# Patient Record
Sex: Female | Born: 1985 | Race: Black or African American | Hispanic: No | Marital: Married | Smoking: Current every day smoker
Health system: Southern US, Community
[De-identification: ages and names within clinical notes are randomized; demographics above are authoritative.]

## PROBLEM LIST (undated history)

## (undated) ENCOUNTER — Inpatient Hospital Stay: Payer: Self-pay

## (undated) DIAGNOSIS — J189 Pneumonia, unspecified organism: Secondary | ICD-10-CM

## (undated) DIAGNOSIS — F32A Depression, unspecified: Secondary | ICD-10-CM

## (undated) DIAGNOSIS — F419 Anxiety disorder, unspecified: Secondary | ICD-10-CM

## (undated) DIAGNOSIS — F329 Major depressive disorder, single episode, unspecified: Secondary | ICD-10-CM

## (undated) DIAGNOSIS — D649 Anemia, unspecified: Secondary | ICD-10-CM

## (undated) HISTORY — PX: TONSILLECTOMY: SUR1361

## (undated) HISTORY — DX: Depression, unspecified: F32.A

## (undated) HISTORY — DX: Anxiety disorder, unspecified: F41.9

## (undated) HISTORY — DX: Major depressive disorder, single episode, unspecified: F32.9

## (undated) SURGERY — Surgical Case
Anesthesia: Spinal

---

## 2007-08-07 ENCOUNTER — Emergency Department: Payer: Self-pay

## 2007-09-09 ENCOUNTER — Emergency Department: Payer: Self-pay | Admitting: Emergency Medicine

## 2007-11-13 ENCOUNTER — Emergency Department: Payer: Self-pay | Admitting: Emergency Medicine

## 2008-01-23 ENCOUNTER — Emergency Department: Payer: Self-pay | Admitting: Emergency Medicine

## 2008-05-16 ENCOUNTER — Emergency Department: Payer: Self-pay | Admitting: Emergency Medicine

## 2008-06-23 ENCOUNTER — Emergency Department (HOSPITAL_COMMUNITY): Admission: EM | Admit: 2008-06-23 | Discharge: 2008-06-23 | Payer: Self-pay | Admitting: Emergency Medicine

## 2008-09-06 ENCOUNTER — Emergency Department: Payer: Self-pay | Admitting: Emergency Medicine

## 2009-01-09 ENCOUNTER — Emergency Department: Payer: Self-pay | Admitting: Emergency Medicine

## 2009-03-30 ENCOUNTER — Emergency Department: Payer: Self-pay | Admitting: Emergency Medicine

## 2011-11-21 ENCOUNTER — Emergency Department: Payer: Self-pay | Admitting: Emergency Medicine

## 2011-12-24 ENCOUNTER — Observation Stay: Payer: Self-pay

## 2011-12-24 LAB — URINALYSIS, COMPLETE
Bacteria: NONE SEEN
Ketone: NEGATIVE
Protein: NEGATIVE
RBC,UR: 17 /HPF (ref 0–5)
WBC UR: 7 /HPF (ref 0–5)

## 2011-12-24 LAB — DRUG SCREEN, URINE
Amphetamines, Ur Screen: NEGATIVE (ref ?–1000)
Barbiturates, Ur Screen: NEGATIVE (ref ?–200)
Benzodiazepine, Ur Scrn: NEGATIVE (ref ?–200)
Cannabinoid 50 Ng, Ur ~~LOC~~: NEGATIVE (ref ?–50)
MDMA (Ecstasy)Ur Screen: NEGATIVE (ref ?–500)
Opiate, Ur Screen: NEGATIVE (ref ?–300)
Phencyclidine (PCP) Ur S: NEGATIVE (ref ?–25)

## 2011-12-25 LAB — URINE CULTURE

## 2012-01-08 ENCOUNTER — Observation Stay: Payer: Self-pay

## 2012-01-08 LAB — URINALYSIS, COMPLETE
Bacteria: NONE SEEN
Glucose,UR: NEGATIVE mg/dL (ref 0–75)
Protein: NEGATIVE
RBC,UR: NONE SEEN /HPF (ref 0–5)
Specific Gravity: 1.006 (ref 1.003–1.030)
Squamous Epithelial: <1
WBC UR: <1 /HPF (ref 0–5)

## 2012-01-31 ENCOUNTER — Observation Stay: Payer: Self-pay

## 2012-02-07 ENCOUNTER — Inpatient Hospital Stay: Payer: Self-pay

## 2012-02-08 LAB — CBC WITH DIFFERENTIAL/PLATELET
Basophil #: 0 10*3/uL (ref 0.0–0.1)
Eosinophil #: 0.2 10*3/uL (ref 0.0–0.7)
MCH: 27.3 pg (ref 26.0–34.0)
MCHC: 32.5 g/dL (ref 32.0–36.0)
Monocyte #: 1 x10 3/mm — ABNORMAL HIGH (ref 0.2–0.9)
Neutrophil %: 70.6 %
Platelet: 233 10*3/uL (ref 150–440)

## 2012-07-10 ENCOUNTER — Emergency Department: Payer: Self-pay | Admitting: Emergency Medicine

## 2012-07-10 LAB — COMPREHENSIVE METABOLIC PANEL
Albumin: 3.5 g/dL (ref 3.4–5.0)
Alkaline Phosphatase: 90 U/L (ref 50–136)
Anion Gap: 4 — ABNORMAL LOW (ref 7–16)
BUN: 9 mg/dL (ref 7–18)
Creatinine: 0.66 mg/dL (ref 0.60–1.30)
EGFR (African American): >60
EGFR (Non-African Amer.): >60
Glucose: 80 mg/dL (ref 65–99)
Osmolality: 279 (ref 275–301)
SGOT(AST): 18 U/L (ref 15–37)
SGPT (ALT): 18 U/L (ref 12–78)

## 2012-07-10 LAB — URINALYSIS, COMPLETE
Bacteria: NONE SEEN
Ketone: NEGATIVE
Ph: 8 (ref 4.5–8.0)
Protein: NEGATIVE
Specific Gravity: 1.021 (ref 1.003–1.030)
Squamous Epithelial: 1

## 2012-07-10 LAB — CBC
HGB: 12 g/dL (ref 12.0–16.0)
MCH: 25.7 pg — ABNORMAL LOW (ref 26.0–34.0)
Platelet: 215 10*3/uL (ref 150–440)
RBC: 4.66 10*6/uL (ref 3.80–5.20)
WBC: 7.3 10*3/uL (ref 3.6–11.0)

## 2012-07-10 LAB — LIPASE, BLOOD: Lipase: 101 U/L (ref 73–393)

## 2012-07-31 ENCOUNTER — Emergency Department: Payer: Self-pay | Admitting: Emergency Medicine

## 2012-07-31 LAB — COMPREHENSIVE METABOLIC PANEL
Albumin: 3.8 g/dL (ref 3.4–5.0)
Alkaline Phosphatase: 92 U/L (ref 50–136)
BUN: 6 mg/dL — ABNORMAL LOW (ref 7–18)
Bilirubin,Total: 0.6 mg/dL (ref 0.2–1.0)
Calcium, Total: 8.6 mg/dL (ref 8.5–10.1)
Co2: 23 mmol/L (ref 21–32)
Creatinine: 0.76 mg/dL (ref 0.60–1.30)
EGFR (African American): >60
Osmolality: 273 (ref 275–301)
SGOT(AST): 21 U/L (ref 15–37)

## 2012-07-31 LAB — URINALYSIS, COMPLETE
Bilirubin,UR: NEGATIVE
Glucose,UR: NEGATIVE mg/dL (ref 0–75)
Ketone: NEGATIVE
Leukocyte Esterase: NEGATIVE
Nitrite: NEGATIVE
Ph: 6 (ref 4.5–8.0)
Specific Gravity: 1.026 (ref 1.003–1.030)
Squamous Epithelial: 4

## 2012-07-31 LAB — RAPID INFLUENZA A&B ANTIGENS

## 2012-07-31 LAB — CBC
MCH: 26.3 pg (ref 26.0–34.0)
MCHC: 31.7 g/dL — ABNORMAL LOW (ref 32.0–36.0)
Platelet: 228 10*3/uL (ref 150–440)
RBC: 4.81 10*6/uL (ref 3.80–5.20)
WBC: 10.6 10*3/uL (ref 3.6–11.0)

## 2012-08-02 ENCOUNTER — Encounter (HOSPITAL_COMMUNITY): Payer: Self-pay | Admitting: Emergency Medicine

## 2012-08-02 ENCOUNTER — Emergency Department (HOSPITAL_COMMUNITY): Payer: Medicaid Other

## 2012-08-02 ENCOUNTER — Emergency Department (HOSPITAL_COMMUNITY)
Admission: EM | Admit: 2012-08-02 | Discharge: 2012-08-02 | Disposition: A | Discharge status: Home / Self Care | Payer: Medicaid Other | Attending: Emergency Medicine | Admitting: Emergency Medicine

## 2012-08-02 DIAGNOSIS — R0602 Shortness of breath: Secondary | ICD-10-CM | POA: Insufficient documentation

## 2012-08-02 DIAGNOSIS — Z862 Personal history of diseases of the blood and blood-forming organs and certain disorders involving the immune mechanism: Secondary | ICD-10-CM | POA: Insufficient documentation

## 2012-08-02 DIAGNOSIS — Z8701 Personal history of pneumonia (recurrent): Secondary | ICD-10-CM | POA: Insufficient documentation

## 2012-08-02 DIAGNOSIS — B349 Viral infection, unspecified: Secondary | ICD-10-CM

## 2012-08-02 DIAGNOSIS — R197 Diarrhea, unspecified: Secondary | ICD-10-CM | POA: Insufficient documentation

## 2012-08-02 DIAGNOSIS — N949 Unspecified condition associated with female genital organs and menstrual cycle: Secondary | ICD-10-CM | POA: Insufficient documentation

## 2012-08-02 DIAGNOSIS — R112 Nausea with vomiting, unspecified: Secondary | ICD-10-CM | POA: Insufficient documentation

## 2012-08-02 DIAGNOSIS — IMO0001 Reserved for inherently not codable concepts without codable children: Secondary | ICD-10-CM | POA: Insufficient documentation

## 2012-08-02 DIAGNOSIS — R6883 Chills (without fever): Secondary | ICD-10-CM | POA: Insufficient documentation

## 2012-08-02 DIAGNOSIS — F17200 Nicotine dependence, unspecified, uncomplicated: Secondary | ICD-10-CM | POA: Insufficient documentation

## 2012-08-02 DIAGNOSIS — Z3202 Encounter for pregnancy test, result negative: Secondary | ICD-10-CM | POA: Insufficient documentation

## 2012-08-02 DIAGNOSIS — R109 Unspecified abdominal pain: Secondary | ICD-10-CM | POA: Insufficient documentation

## 2012-08-02 DIAGNOSIS — N938 Other specified abnormal uterine and vaginal bleeding: Secondary | ICD-10-CM | POA: Insufficient documentation

## 2012-08-02 DIAGNOSIS — B9789 Other viral agents as the cause of diseases classified elsewhere: Secondary | ICD-10-CM | POA: Insufficient documentation

## 2012-08-02 HISTORY — DX: Anemia, unspecified: D64.9

## 2012-08-02 HISTORY — DX: Pneumonia, unspecified organism: J18.9

## 2012-08-02 LAB — CBC WITH DIFFERENTIAL/PLATELET
Basophils Absolute: 0 10*3/uL (ref 0.0–0.1)
Basophils Relative: 1 % (ref 0–1)
Eosinophils Absolute: 0.2 10*3/uL (ref 0.0–0.7)
Eosinophils Relative: 4 % (ref 0–5)
HCT: 37.5 % (ref 36.0–46.0)
MCH: 26.7 pg (ref 26.0–34.0)
MCHC: 32.8 g/dL (ref 30.0–36.0)
MCV: 81.3 fL (ref 78.0–100.0)
Monocytes Absolute: 0.8 10*3/uL (ref 0.1–1.0)
Platelets: 214 10*3/uL (ref 150–400)
RDW: 14.4 % (ref 11.5–15.5)
WBC: 6.3 10*3/uL (ref 4.0–10.5)

## 2012-08-02 LAB — COMPREHENSIVE METABOLIC PANEL
Alkaline Phosphatase: 68 U/L (ref 39–117)
CO2: 25 mEq/L (ref 19–32)
Calcium: 8.9 mg/dL (ref 8.4–10.5)
Creatinine, Ser: 0.76 mg/dL (ref 0.50–1.10)
GFR calc Af Amer: >90 mL/min (ref >90)
Sodium: 138 mEq/L (ref 135–145)
Total Bilirubin: 0.8 mg/dL (ref 0.3–1.2)

## 2012-08-02 LAB — URINALYSIS, MICROSCOPIC ONLY
Leukocytes, UA: NEGATIVE
Protein, ur: NEGATIVE mg/dL
Specific Gravity, Urine: 1.037 — ABNORMAL HIGH (ref 1.005–1.030)
Urobilinogen, UA: 2 mg/dL — ABNORMAL HIGH (ref 0.0–1.0)

## 2012-08-02 LAB — LIPASE, BLOOD: Lipase: 18 U/L (ref 11–59)

## 2012-08-02 MED ORDER — SODIUM CHLORIDE 0.9 % IV BOLUS (SEPSIS)
1000.0000 mL | Freq: Once | INTRAVENOUS | Status: AC
  Administered 2012-08-02: 1000 mL via INTRAVENOUS

## 2012-08-02 MED ORDER — MEDROXYPROGESTERONE ACETATE 5 MG PO TABS: 5.0000 mg | ORAL_TABLET | Freq: Every day | ORAL | Status: DC

## 2012-08-02 MED ORDER — ONDANSETRON HCL 4 MG PO TABS: 4.0000 mg | ORAL_TABLET | Freq: Three times a day (TID) | ORAL | Status: DC | PRN

## 2012-08-02 MED ORDER — ONDANSETRON HCL 4 MG/2ML IJ SOLN
4.0000 mg | Freq: Once | INTRAMUSCULAR | Status: AC
  Administered 2012-08-02: 4 mg via INTRAVENOUS
  Filled 2012-08-02: qty 2

## 2012-08-02 NOTE — ED Notes (Signed)
Pt ambulatory leaving ED by self. Pt states mother coming to get her from ED. Pt has no further questions upon d/c teaching. Pt given prescriptions. Pt does not appear to be in acute distress upon d/c.

## 2012-08-02 NOTE — ED Provider Notes (Signed)
History     CSN: [id number]  Arrival date & time 08/02/12  1103   First MD Initiated Contact with Patient 08/02/12 1135      Chief Complaint  Patient presents with  . Emesis  . Nausea  . Chills    (Consider location/radiation/quality/duration/timing/severity/associated sxs/prior treatment) Patient is a 27 y.o. female presenting with vomiting. The history is provided by the patient.  Emesis  This is a new problem. The current episode started 2 days ago. The problem occurs 2 to 4 times per day. The problem has not changed since onset.The emesis has an appearance of stomach contents. There has been no fever. Associated symptoms include abdominal pain, chills, diarrhea, myalgias and sweats. Pertinent negatives include no cough and no URI. Risk factors include ill contacts.    Past Medical History  Diagnosis Date  . Pneumonia   . Anemia     Past Surgical History  Procedure Date  . Tonsillectomy     History reviewed. No pertinent family history.  History  Substance Use Topics  . Smoking status: Current Some Day Smoker  . Smokeless tobacco: Not on file  . Alcohol Use: No    OB History    Grav Para Term Preterm Abortions TAB SAB Ect Mult Living                  Review of Systems  Constitutional: Positive for chills.  Respiratory: Positive for shortness of breath. Negative for cough.        States today when walking up the stairs she felt very weak and sob  Gastrointestinal: Positive for vomiting, abdominal pain and diarrhea.  Musculoskeletal: Positive for myalgias.  All other systems reviewed and are negative.    Allergies  Latex and Zithromax  Home Medications  No current outpatient prescriptions on file.  BP 107/57  Pulse 83  Temp 97.9 F (36.6 C) (Oral)  Resp 20  SpO2 99%  Physical Exam  Nursing note and vitals reviewed. Constitutional: She is oriented to person, place, and time. She appears well-developed and well-nourished. No distress.  HENT:    Head: Normocephalic and atraumatic.  Right Ear: Tympanic membrane and ear canal normal.  Left Ear: Tympanic membrane and ear canal normal.  Mouth/Throat: Oropharynx is clear and moist.  Eyes: Conjunctivae normal and EOM are normal. Pupils are equal, round, and reactive to light.  Neck: Normal range of motion. Neck supple. No Brudzinski's sign and no Kernig's sign noted.  Cardiovascular: Normal rate, regular rhythm and intact distal pulses.   No murmur heard. Pulmonary/Chest: Effort normal and breath sounds normal. No respiratory distress. She has no wheezes. She has no rales.  Abdominal: Soft. She exhibits no distension. There is no tenderness. There is no rebound and no guarding.  Musculoskeletal: Normal range of motion. She exhibits no edema and no tenderness.  Lymphadenopathy:    She has no cervical adenopathy.  Neurological: She is alert and oriented to person, place, and time.  Skin: Skin is warm and dry. No rash noted. No erythema.  Psychiatric: She has a normal mood and affect. Her behavior is normal.    ED Course  Procedures (including critical care time)  Labs Reviewed  URINALYSIS, MICROSCOPIC ONLY - Abnormal; Notable for the following:    Color, Urine AMBER (*)  BIOCHEMICALS MAY BE AFFECTED BY COLOR   APPearance CLOUDY (*)     Specific Gravity, Urine 1.037 (*)     Hgb urine dipstick LARGE (*)     Bilirubin Urine  SMALL (*)     Ketones, ur 40 (*)     Urobilinogen, UA 2.0 (*)     All other components within normal limits  CBC WITH DIFFERENTIAL - Abnormal; Notable for the following:    Monocytes Relative 13 (*)     All other components within normal limits  COMPREHENSIVE METABOLIC PANEL  LIPASE, BLOOD  POCT PREGNANCY, URINE   Dg Abd Acute W/chest  08/02/2012  *RADIOLOGY REPORT*  Clinical Data: Mid abdominal pain.  Nausea and vomiting.  Shortness of breath.  ACUTE ABDOMEN SERIES (ABDOMEN 2 VIEW & CHEST 1 VIEW)  Comparison: None.  Findings: Bowel gas pattern unremarkable  without evidence of obstruction or significant ileus.  No evidence of free air or significant air fluid levels on the erect image.  No abnormal calcifications.  Regional skeleton intact.  Cardiomediastinal silhouette unremarkable.   Lungs clear. Bronchovascular markings normal.  Pulmonary vascularity normal.  No pneumothorax.  No pleural effusions.  IMPRESSION: No acute abdominal or pulmonary abnormality.   Original Report Authenticated By: Hulan Saas, M.D.      No diagnosis found.    MDM   Patient with nausea, vomiting, myalgias and loose stool but has been ongoing since Thursday. Also complaining of an intermittent headache and chills. She has no meningeal signs on exam and has full range of motion of her neck. She had a flu swab done at [facility] which was negative but was never seen by a physician in left prior to seeing a physician. She states she was able to keep down some fluids last night but vomited again this morning which prompted her visit here. Also she's complaining of persistent vaginal bleeding for the last month that has not resolved despite having implanon.  No localized abdominal pain or concern for appendicitis, cholecystitis, pancreatitis or diverticulitis at this time.  Will give the patient IV fluids is feel most likely this is viral however do to 2 days course of time we'll get a CBC, CMP, lipase, UA and UPT. Patient also given Zofran.  12:47 PM Labs wnl other than mild dehydration. Plain film is within normal limits. Patient by mouth challenged and tolerated well. Will discharge home with Provera due to heavy vaginal bleeding for the last one month to follow up with OB/GYN. And Zofran for nausea prn.       Gwyneth Sprout, MD 08/02/12 1342

## 2012-08-02 NOTE — ED Notes (Signed)
Pt states she has a little headache; pt denies pain; pt states nauseous; pt denies vomiting and able to keep some fluids down.

## 2012-08-02 NOTE — ED Notes (Signed)
Pt states symptoms started Thursday morning. Pt states she unable to keep any food or fluids down. Pt states went to [facility] Thursday and was told she did not have the flu. Pt c/o of headache without relief from ibuprofen. Pt states has N/V. Pt states she has chills. Pt states she has been unable to sleep.

## 2012-10-21 ENCOUNTER — Emergency Department: Payer: Self-pay | Admitting: Emergency Medicine

## 2012-10-21 LAB — CBC
HCT: 40.3 % (ref 35.0–47.0)
HGB: 13.2 g/dL (ref 12.0–16.0)
MCH: 27 pg (ref 26.0–34.0)
MCHC: 32.7 g/dL (ref 32.0–36.0)
Platelet: 239 10*3/uL (ref 150–440)
RBC: 4.87 10*6/uL (ref 3.80–5.20)
RDW: 14.1 % (ref 11.5–14.5)

## 2012-10-21 LAB — COMPREHENSIVE METABOLIC PANEL
Anion Gap: 7 (ref 7–16)
Bilirubin,Total: 1.2 mg/dL — ABNORMAL HIGH (ref 0.2–1.0)
Calcium, Total: 8.9 mg/dL (ref 8.5–10.1)
Co2: 25 mmol/L (ref 21–32)
EGFR (African American): >60
Osmolality: 264 (ref 275–301)
Potassium: 3.1 mmol/L — ABNORMAL LOW (ref 3.5–5.1)
SGOT(AST): 22 U/L (ref 15–37)
SGPT (ALT): 20 U/L (ref 12–78)
Total Protein: 8.3 g/dL — ABNORMAL HIGH (ref 6.4–8.2)

## 2012-10-21 LAB — URINALYSIS, COMPLETE
Bilirubin,UR: NEGATIVE
Leukocyte Esterase: NEGATIVE
Nitrite: NEGATIVE
Ph: 6 (ref 4.5–8.0)

## 2012-10-21 LAB — HCG, QUANTITATIVE, PREGNANCY: Beta Hcg, Quant.: 38469 m[IU]/mL — ABNORMAL HIGH

## 2012-10-21 LAB — WET PREP, GENITAL

## 2012-11-30 ENCOUNTER — Emergency Department: Payer: Self-pay | Admitting: Emergency Medicine

## 2012-11-30 LAB — BASIC METABOLIC PANEL
Anion Gap: 7 (ref 7–16)
BUN: 4 mg/dL — ABNORMAL LOW (ref 7–18)
Calcium, Total: 8.7 mg/dL (ref 8.5–10.1)
Chloride: 107 mmol/L (ref 98–107)
Co2: 23 mmol/L (ref 21–32)
EGFR (Non-African Amer.): >60
Glucose: 82 mg/dL (ref 65–99)
Osmolality: 270 (ref 275–301)
Potassium: 3.5 mmol/L (ref 3.5–5.1)
Sodium: 137 mmol/L (ref 136–145)

## 2012-11-30 LAB — URINALYSIS, COMPLETE
Nitrite: NEGATIVE
Ph: 5 (ref 4.5–8.0)
Protein: 30
Squamous Epithelial: >30

## 2012-11-30 LAB — GC/CHLAMYDIA PROBE AMP

## 2012-11-30 LAB — CBC
HCT: 33.6 % — ABNORMAL LOW (ref 35.0–47.0)
MCH: 27.3 pg (ref 26.0–34.0)
MCHC: 33.9 g/dL (ref 32.0–36.0)
Platelet: 232 10*3/uL (ref 150–440)
WBC: 6.5 10*3/uL (ref 3.6–11.0)

## 2012-11-30 LAB — WET PREP, GENITAL

## 2013-06-08 ENCOUNTER — Ambulatory Visit: Payer: Self-pay | Admitting: Obstetrics and Gynecology

## 2013-06-08 LAB — CBC WITH DIFFERENTIAL/PLATELET
Eosinophil #: 0.2 10*3/uL (ref 0.0–0.7)
HCT: 32 % — ABNORMAL LOW (ref 35.0–47.0)
Lymphocyte #: 2.5 10*3/uL (ref 1.0–3.6)
MCV: 80 fL (ref 80–100)
Monocyte %: 8.1 %
Neutrophil #: 10 10*3/uL — ABNORMAL HIGH (ref 1.4–6.5)
RDW: 14.1 % (ref 11.5–14.5)
WBC: 13.8 10*3/uL — ABNORMAL HIGH (ref 3.6–11.0)

## 2013-06-09 ENCOUNTER — Inpatient Hospital Stay: Payer: Self-pay | Admitting: Obstetrics and Gynecology

## 2014-06-08 ENCOUNTER — Emergency Department: Payer: Self-pay | Admitting: Emergency Medicine

## 2014-06-08 LAB — CBC
HCT: 38 % (ref 35.0–47.0)
HGB: 12.2 g/dL (ref 12.0–16.0)
MCH: 27.1 pg (ref 26.0–34.0)
MCHC: 32.1 g/dL (ref 32.0–36.0)
MCV: 84 fL (ref 80–100)
PLATELETS: 194 10*3/uL (ref 150–440)
RBC: 4.5 10*6/uL (ref 3.80–5.20)
RDW: 15.3 % — AB (ref 11.5–14.5)
WBC: 5.2 10*3/uL (ref 3.6–11.0)

## 2014-06-08 LAB — URINALYSIS, COMPLETE
Bilirubin,UR: NEGATIVE
Glucose,UR: NEGATIVE mg/dL (ref 0–75)
LEUKOCYTE ESTERASE: NEGATIVE
Nitrite: NEGATIVE
Ph: 6 (ref 4.5–8.0)
Protein: 30
RBC,UR: 4 /HPF (ref 0–5)
Specific Gravity: 1.028 (ref 1.003–1.030)
Squamous Epithelial: 11
WBC UR: 3 /HPF (ref 0–5)

## 2014-06-08 LAB — BASIC METABOLIC PANEL
Anion Gap: 9 (ref 7–16)
BUN: 8 mg/dL (ref 7–18)
Calcium, Total: 8.2 mg/dL — ABNORMAL LOW (ref 8.5–10.1)
Chloride: 110 mmol/L — ABNORMAL HIGH (ref 98–107)
Co2: 25 mmol/L (ref 21–32)
Creatinine: 0.72 mg/dL (ref 0.60–1.30)
EGFR (African American): >60
EGFR (Non-African Amer.): >60
Glucose: 89 mg/dL (ref 65–99)
Osmolality: 285 (ref 275–301)
Potassium: 3.1 mmol/L — ABNORMAL LOW (ref 3.5–5.1)
Sodium: 144 mmol/L (ref 136–145)

## 2014-06-08 LAB — TROPONIN I: Troponin-I: <0.02 ng/mL

## 2014-10-20 ENCOUNTER — Emergency Department: Payer: Self-pay | Admitting: Emergency Medicine

## 2014-10-25 LAB — CBC
HCT: 39.3 % (ref 35.0–47.0)
HGB: 12.2 g/dL (ref 12.0–16.0)
MCH: 25.7 pg — ABNORMAL LOW (ref 26.0–34.0)
MCHC: 31 g/dL — ABNORMAL LOW (ref 32.0–36.0)
MCV: 83 fL (ref 80–100)
PLATELETS: 226 10*3/uL (ref 150–440)
RBC: 4.74 10*6/uL (ref 3.80–5.20)
RDW: 14.3 % (ref 11.5–14.5)
WBC: 4.4 10*3/uL (ref 3.6–11.0)

## 2014-10-25 LAB — COMPREHENSIVE METABOLIC PANEL
AST: 17 U/L
Albumin: 4 g/dL
Alkaline Phosphatase: 69 U/L
Anion Gap: 7 (ref 7–16)
BUN: 10 mg/dL
Bilirubin,Total: 0.9 mg/dL
CALCIUM: 9.3 mg/dL
CHLORIDE: 106 mmol/L
CO2: 29 mmol/L
Creatinine: 0.65 mg/dL
Glucose: 90 mg/dL
POTASSIUM: 3.7 mmol/L
SGPT (ALT): 12 U/L — ABNORMAL LOW
SODIUM: 142 mmol/L
Total Protein: 7.7 g/dL

## 2014-10-25 LAB — URINALYSIS, COMPLETE
BILIRUBIN, UR: NEGATIVE
GLUCOSE, UR: NEGATIVE mg/dL (ref 0–75)
Ketone: NEGATIVE
NITRITE: NEGATIVE
Ph: 5 (ref 4.5–8.0)
Protein: 30
Specific Gravity: 1.027 (ref 1.003–1.030)

## 2014-10-25 LAB — LIPASE, BLOOD: Lipase: 21 U/L — ABNORMAL LOW

## 2014-10-25 LAB — GC/CHLAMYDIA PROBE AMP

## 2014-10-25 LAB — WET PREP, GENITAL

## 2014-11-19 NOTE — Op Note (Signed)
PATIENT NAME:  Brandy Luna, Brandy Luna MR#:  [id number] DATE OF BIRTH:  1985/12/01  DATE OF PROCEDURE:  06/09/2013  PREOPERATIVE DIAGNOSES: 1.  Intrauterine pregnancy at 339 weeks gestational age.  2.  History of prior cesarean section, desires repeat.   POSTOPERATIVE DIAGNOSES: 1.  Intrauterine pregnancy at 6439 weeks gestational age.  2.  History of prior cesarean section, desires repeat.   PROCEDURE: Repeat low transverse cesarean section via Pfannenstiel incision.   ANESTHESIA: Spinal.   SURGEON: Thomasene MohairStephen Rian Busche, M.D.   ASSISTANT: Senaida LangeLashawn Weaver Lee, M.D.   ESTIMATED BLOOD LOSS: 750 mL   OPERATIVE FLUIDS: 1000 mL crystalloid.   COMPLICATIONS: None.   FINDINGS: 1.  Viable female infant with Apgars of 8 at 1 minute and 9 at 5 minutes with a weight of 2890 grams.  2.  Normal-appearing gravid uterus, fallopian tubes and ovaries.   SPECIMENS: None.   CONDITION AT END OF PROCEDURE: Stable.   PROCEDURE IN DETAIL: The patient was taken to the operating room where spinal anesthesia was administered and found to be adequate. She was placed in the dorsal supine position with a leftward tilt and prepped and draped in the usual sterile fashion. After timeout was called, a Pfannenstiel incision was made and carried through the various layers until the peritoneum was identified and entered sharply. The peritoneal opening was extended in the cranial and caudal directions. A bladder retractor was placed and a low transverse hysterotomy was created with a scalpel. The hysterotomy was extended laterally with cranial and caudal tension. The fetal vertex was grasped and elevated to the hysterotomy and with fundal pressure, the head followed by the shoulders and the rest of the body were delivered without difficulty. The cord was clamped and cut and the infant was handed to the pediatrician. Cord blood was collected. The placenta was removed and the uterus exteriorized and cleared of all clots and debris. The  hysterotomy was closed using #0 Vicryl in a running locked fashion. A second layer of the same suture was used to obtain hemostasis. The uterus was returned to the abdomen and the abdomen was cleared of all clots and debris. The peritoneum was reapproximated using 2-0 Vicryl in a running fashion.   The On-Q packed catheters were inserted according to the manufacturer's recommendations. They were placed approximately 4 cm cephalad to the incision line, approximately 1 cm apart straddling the midline. They were positioned at approximately the third marking on the catheter at a depth just superficial to the rectus abdominis muscles and just deep to the rectus fascia.   The fascia was closed using #0 Maxon using 2 sutures, each starting at the lateral apices and meeting in the midline where they were tied together. The subcutaneous tissue was reapproximated using a 2-0 plain gut such that no greater than 2 cm of dead space remained. The skin was closed using staples.   The On-Q catheters were affixed to the skin using Dermabond, Steri-Strips as well as Tegaderm. Each catheter was bolused with 5 mL of 5% Marcaine plain for a total of 10 mL.   The patient tolerated the procedure well. Sponge, lap, and needle counts were correct x 2. For antibiotic prophylaxis, the patient received 2 grams of Ancef prior to skin incision. For VTE prophylaxis, the patient was wearing pneumatic compression stockings which were on and operating throughout the entire procedure. The patient was taken from operating room to the recovery area in stable condition.    ____________________________ Conard NovakStephen D. Lavern Crimi, MD sdj:dp D: 06/09/2013  09:08:20 ET T: 06/09/2013 09:26:55 ET JOB#: [id number]  cc: Conard Novak, MD, <Dictator> Conard Novak MD ELECTRONICALLY SIGNED 06/14/2013 12:09

## 2014-11-21 NOTE — Op Note (Signed)
PATIENT NAME:  Brandy Luna, FILTER MR#:  [id number] DATE OF BIRTH:  Jan 12, 1986  DATE OF PROCEDURE:  02/08/2012  PREOPERATIVE DIAGNOSES:  1. Intrauterine pregnancy at [redacted] weeks gestational age.  2. Fetal intolerance of labor.   POSTOPERATIVE DIAGNOSES: 1. Intrauterine pregnancy at [redacted] weeks gestational age.  2. Fetal intolerance of labor.   PROCEDURE: Primary low transverse Cesarean section via Pfannenstiel incision.   ANESTHESIA: Epidural.   SURGEON: Conard Novak, MD   ESTIMATED BLOOD LOSS: 800 mL.  OPERATIVE FLUIDS: 800 mL.   COMPLICATIONS: None.   FINDINGS:  1. Viable female infant with Apgars of 9 and 9 at one and five minutes respectively.  2. Gravid uterus, normal appearing fallopian tubes and ovaries.   SPECIMENS: None.   CONDITION: Stable at the end of the procedure.   INDICATIONS FOR PROCEDURE: [name] is a 29 year old gravida 1 female at [redacted] weeks gestational age who is being induced for dates. She had progressed to the ripening portion of her induction, however, once her induction began she had initial episode of fetal heart rate deceleration with recovery after intrauterine resuscitative measures; however, she had a second and prolonged episode that lasted approximately six minutes that did respond to in utero resuscitative measures, however, her cervical dilatation was 5 cm and at this point decision was made to proceed with abdominal delivery. The risks and benefits of continuing with induction versus proceeding with Cesarean section were explained to the patient. The patient understood the recommendation and agreed to abdominal delivery.   PROCEDURE IN DETAIL: The patient was taken to the operating room where she was administered an epidural anesthesia which was found to be adequate. She was placed in the dorsal supine position with a leftward tilt. She was prepped and draped in the usual sterile fashion. A Pfannenstiel incision was made and the peritoneum was  identified and entered sharply. The peritoneal opening was extended in cranial and caudal directions with lateral traction. A bladder flap was created and the bladder retractor was placed to pull the bladder out of the operative area of interest. A low transverse incision was made on the uterus and the incision was extended laterally with cranial and caudal tension. The fetal vertex was grasped and elevated to the hysterotomy and the fetal head followed by the shoulders and the rest of the body were delivered without difficulty. The cord was clamped and then cut and the infant was handed to the awaiting pediatrician. Cord blood was collected. The placenta was then delivered manually. The uterus was exteriorized and cleared of all clots and debris. The hysterotomy was closed with #0 Vicryl in a running locked fashion. A second layer of the same suture was used to obtain hemostasis. The uterus was returned to the abdomen. The gutters were cleared of all clots and debris. The peritoneum was closed using #0 Vicryl in a running fashion.   The On-Q pump pain system was then placed at this point. The catheters were placed approximately 4 cm cephalad to the incision line and approximately 2 cm apart straddling the midline. They were inserted to the level just superficial to the rectus abdominis muscles and just deep to the rectus fascia. They were inserted to the level of the third mark on the catheter.   The fascia was closed using PDS. The skin was closed in a subcuticular fashion using 4-0 Monocryl.   The On-Q catheters were affixed to the skin using Dermabond and Steri-Strips as well as Tegaderm. Each catheter was bolused with  5 mL of 0.5% Marcaine for a total of 10 mL.   The patient tolerated the procedure well. Sponge, lap, and needle counts were correct x2. The patient received Ancef 2 grams for antibiotic prophylaxis prior to skin incision.   ____________________________ Conard NovakStephen D. Najai Waszak,  MD sdj:drc D: 02/08/2012 19:31:22 ET T: 02/09/2012 08:56:44 ET JOB#: [id number] cc: Conard NovakStephen D. Chrisean Kloth, MD, <Dictator> Conard NovakSTEPHEN D Cristal Howatt MD ELECTRONICALLY SIGNED 02/28/2012 19:59

## 2014-12-07 NOTE — H&P (Signed)
L&D Evaluation:  History:   HPI 29 year old G1P0 presents to L&D for IOL due to postdates (41 weeks). EDD 02/01/12 confirmed by 8 week and 20 week U/S. NST on 01/31/12 reactive, no LOF, no VB, rare ctx's. PNC transferred to [facility] at 18 weeks from another clinic, notable for trich+ and treated x2, UTI x1, no other significant events during pregnancy. Labs: O Positive, RI, VI, Hep B and HIV Negative, RPR NR GBS Negative Tdap recieved 11/15/11    Presents with IOL-postdates    Patient's Medical History No Chronic Illness    Patient's Surgical History T&A    Medications Pre Natal Vitamins    Allergies zithromax, latex    Social History tobacco  previous MJ use    Family History Non-Contributory   ROS:   ROS All systems were reviewed.  HEENT, CNS, GI, GU, Respiratory, CV, Renal and Musculoskeletal systems were found to be normal.   Exam:   Vital Signs stable    Urine Protein not completed    General no apparent distress    Mental Status clear    Abdomen gravid, non-tender    Estimated Fetal Weight Average for gestational age    Edema no edema    Pelvic no external lesions, cervix closed and thick, bishop score 4    Mebranes Intact    FHT normal rate with no decels    Ucx rare    Skin dry   Impression:   Impression other, 41 week, postdates IOL   Plan:   Plan see intrapartum orders, cervidil then Pitocin    Comments Pt is set up for cervidil on 7/11 at 2000 with Pitocin on 7/12   Electronic Signatures: Shella Maximutnam, Ryoma Nofziger (CNM)  (Signed 04-Jul-13 18:43)  Authored: L&D Evaluation   Last Updated: 04-Jul-13 18:43 by Shella MaximPutnam, Najiyah Paris (CNM)
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
## 2014-12-07 NOTE — H&P (Signed)
L&D Evaluation:  History:   HPI 29 year old G1P0 presents to L&D for IOL due to postdates (41 weeks). EDD 02/01/12 confirmed by 8 week and 20 week U/S. NST on 01/31/12 reactive, no LOF, no VB, rare ctx's. PNC transferred to [facility] at 18 weeks from another clinic, notable for trich+ and treated x2, UTI x1, no other significant events during pregnancy. Labs: O Positive, RI, VI, Hep B and HIV Negative, RPR NR GBS Negative Tdap recieved 11/15/11    Presents with IOL-postdates    Patient's Medical History No Chronic Illness    Patient's Surgical History T&A    Medications Pre Natal Vitamins    Allergies zithromax, latex    Social History tobacco  previous MJ use    Family History Non-Contributory   ROS:   ROS see HPi   Exam:   Vital Signs stable    Urine Protein not completed    General no apparent distress    Mental Status clear    Chest clear    Heart normal sinus rhythm, no murmur/gallop/rubs    Abdomen gravid, non-tender    Estimated Fetal Weight Average for gestational age    Fetal Position cephalic    Edema no edema    Reflexes 1+    Pelvic no external lesions, closed/75%/-2    Mebranes Intact    FHT normal rate with no decels, reactive tracing    FHT Description moderate variability    Ucx occ    Skin dry   Impression:   Impression IUP at 40 6/7 weeks for IOL   Plan:   Plan Cervidil ripening. Discussed risks of induction including hyperstimulation, FITL, failed induction, and C-section whether using Cytotec, Cervidil, or Pitocin.  Patient wishes to proceed with cervidil insertion.   Electronic Signatures: Trinna BalloonGutierrez, Mayola Mcbain L (CNM)  (Signed 11-Jul-13 22:37)  Authored: L&D Evaluation   Last Updated: 11-Jul-13 22:37 by Trinna BalloonGutierrez, Seiya Silsby L (CNM)
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
## 2014-12-07 NOTE — H&P (Signed)
L&D Evaluation:  History:   HPI 29 year old G1P0 presents to L&D with c/o ctx's. EDD 02/01/12 confirmed by 8 week and 20 week U/S, 39 6/7 weeks today. ctx's off and on for several days but got worse today. Good FM, no VB, no LOF. PNC transferred to [facility] at 18 weeks from another clinic, notable for trich+ and treated x2, UTI x1, no other significant events during pregnancy. Labs: O Positive, RI, VI, Hep B and HIV Negative, RPR NR GBS Negative Tdap recieved 11/15/11    Presents with contractions    Patient's Medical History No Chronic Illness    Patient's Surgical History T&A    Medications Pre Natal Vitamins    Allergies zithromax, latex    Social History tobacco  previous MJ use    Family History Non-Contributory   ROS:   ROS All systems were reviewed.  HEENT, CNS, GI, GU, Respiratory, CV, Renal and Musculoskeletal systems were found to be normal.   Exam:   Vital Signs stable    Urine Protein not completed    General no apparent distress    Mental Status clear    Abdomen gravid, non-tender    Estimated Fetal Weight Average for gestational age    Edema no edema    Pelvic no external lesions, cervix closed and thick, exam per RN    Mebranes Intact    FHT normal rate with no decels    Ucx rare    Skin dry   Impression:   Impression reactive NST, 39+ weeks, R/o labor   Plan:   Plan EFM/NST, monitor contractions and for cervical change, discharge    Comments pt has been checked 2 times with no cervical change, pt acting like she is very uncomfortable and crying and stating she is "miserable", IM morphine and phenergan was given and pt was able to rest, will dc to home now. Pt is set up for cervidil on 7/11 at 2000 with Pitocin on 7/12 follow up with [facility] as sched next week.    Follow Up Appointment already scheduled   Electronic Signatures: Shella Maximutnam, Swayze Pries (CNM)  (Signed 04-Jul-13 18:25)  Authored: L&D Evaluation   Last Updated: 04-Jul-13 18:25 by  Shella MaximPutnam, Kerryann Allaire (CNM)

## 2014-12-16 ENCOUNTER — Encounter: Payer: Self-pay | Admitting: Emergency Medicine

## 2014-12-16 ENCOUNTER — Emergency Department
Admission: EM | Admit: 2014-12-16 | Discharge: 2014-12-16 | Disposition: A | Discharge status: Home / Self Care | Payer: Medicaid Other | Attending: Emergency Medicine | Admitting: Emergency Medicine

## 2014-12-16 DIAGNOSIS — R112 Nausea with vomiting, unspecified: Secondary | ICD-10-CM | POA: Insufficient documentation

## 2014-12-16 DIAGNOSIS — R197 Diarrhea, unspecified: Secondary | ICD-10-CM | POA: Insufficient documentation

## 2014-12-16 DIAGNOSIS — Z79899 Other long term (current) drug therapy: Secondary | ICD-10-CM | POA: Insufficient documentation

## 2014-12-16 DIAGNOSIS — A5901 Trichomonal vulvovaginitis: Secondary | ICD-10-CM | POA: Insufficient documentation

## 2014-12-16 DIAGNOSIS — Z720 Tobacco use: Secondary | ICD-10-CM | POA: Insufficient documentation

## 2014-12-16 DIAGNOSIS — Z3202 Encounter for pregnancy test, result negative: Secondary | ICD-10-CM | POA: Insufficient documentation

## 2014-12-16 DIAGNOSIS — N939 Abnormal uterine and vaginal bleeding, unspecified: Secondary | ICD-10-CM | POA: Insufficient documentation

## 2014-12-16 LAB — CBC WITH DIFFERENTIAL/PLATELET
BASOS ABS: 0.1 10*3/uL (ref 0–0.1)
BASOS PCT: 1 %
Eosinophils Absolute: 0.3 10*3/uL (ref 0–0.7)
Eosinophils Relative: 4 %
HCT: 41.9 % (ref 35.0–47.0)
Hemoglobin: 13.4 g/dL (ref 12.0–16.0)
Lymphocytes Relative: 40 %
Lymphs Abs: 2.7 10*3/uL (ref 1.0–3.6)
MCH: 26.2 pg (ref 26.0–34.0)
MCHC: 31.9 g/dL — AB (ref 32.0–36.0)
MCV: 82 fL (ref 80.0–100.0)
Monocytes Absolute: 0.6 10*3/uL (ref 0.2–0.9)
Monocytes Relative: 9 %
NEUTROS ABS: 3.2 10*3/uL (ref 1.4–6.5)
Neutrophils Relative %: 46 %
Platelets: 201 10*3/uL (ref 150–440)
RBC: 5.11 MIL/uL (ref 3.80–5.20)
RDW: 14.5 % (ref 11.5–14.5)
WBC: 6.9 10*3/uL (ref 3.6–11.0)

## 2014-12-16 LAB — COMPREHENSIVE METABOLIC PANEL
ALK PHOS: 59 U/L (ref 38–126)
ALT: 15 U/L (ref 14–54)
ANION GAP: 9 (ref 5–15)
AST: 21 U/L (ref 15–41)
Albumin: 4.4 g/dL (ref 3.5–5.0)
BUN: 11 mg/dL (ref 6–20)
CO2: 28 mmol/L (ref 22–32)
CREATININE: 0.73 mg/dL (ref 0.44–1.00)
Calcium: 9.5 mg/dL (ref 8.9–10.3)
Chloride: 102 mmol/L (ref 101–111)
GFR calc Af Amer: >60 mL/min (ref >60)
GFR calc non Af Amer: >60 mL/min (ref >60)
GLUCOSE: 84 mg/dL (ref 65–99)
POTASSIUM: 3.6 mmol/L (ref 3.5–5.1)
Sodium: 139 mmol/L (ref 135–145)
TOTAL PROTEIN: 8.2 g/dL — AB (ref 6.5–8.1)
Total Bilirubin: 1 mg/dL (ref 0.3–1.2)

## 2014-12-16 LAB — WET PREP, GENITAL
Clue Cells Wet Prep HPF POC: NONE SEEN — AB
Yeast Wet Prep HPF POC: NONE SEEN — AB

## 2014-12-16 LAB — URINALYSIS COMPLETE WITH MICROSCOPIC (ARMC ONLY)
BACTERIA UA: NONE SEEN
Glucose, UA: NEGATIVE mg/dL
KETONES UR: NEGATIVE mg/dL
Nitrite: NEGATIVE
Protein, ur: 30 mg/dL — AB
SPECIFIC GRAVITY, URINE: 1.029 (ref 1.005–1.030)
pH: 6 (ref 5.0–8.0)

## 2014-12-16 LAB — POCT PREGNANCY, URINE: Preg Test, Ur: NEGATIVE

## 2014-12-16 LAB — LIPASE, BLOOD: Lipase: 29 U/L (ref 22–51)

## 2014-12-16 LAB — CHLAMYDIA/NGC RT PCR (ARMC ONLY)
Chlamydia Tr: DETECTED
N GONORRHOEAE: NOT DETECTED

## 2014-12-16 MED ORDER — ONDANSETRON 4 MG PO TBDP
ORAL_TABLET | ORAL | Status: AC
  Administered 2014-12-16: 4 mg via ORAL
  Filled 2014-12-16: qty 1

## 2014-12-16 MED ORDER — OXYCODONE-ACETAMINOPHEN 5-325 MG PO TABS
ORAL_TABLET | ORAL | Status: AC
  Filled 2014-12-16: qty 1

## 2014-12-16 MED ORDER — OXYCODONE-ACETAMINOPHEN 5-325 MG PO TABS
2.0000 | ORAL_TABLET | Freq: Once | ORAL | Status: AC
Start: 2014-12-16 — End: 2014-12-16
  Administered 2014-12-16: 2 via ORAL

## 2014-12-16 MED ORDER — METRONIDAZOLE 500 MG PO TABS
2000.0000 mg | ORAL_TABLET | Freq: Once | ORAL | Status: AC
  Administered 2014-12-16: 2000 mg via ORAL

## 2014-12-16 MED ORDER — OXYCODONE-ACETAMINOPHEN 5-325 MG PO TABS
ORAL_TABLET | ORAL | Status: AC
  Administered 2014-12-16: 2 via ORAL
  Filled 2014-12-16: qty 1

## 2014-12-16 MED ORDER — METRONIDAZOLE 500 MG PO TABS
ORAL_TABLET | ORAL | Status: AC
  Administered 2014-12-16: 2000 mg via ORAL
  Filled 2014-12-16: qty 4

## 2014-12-16 MED ORDER — ONDANSETRON 4 MG PO TBDP
4.0000 mg | ORAL_TABLET | Freq: Once | ORAL | Status: AC
  Administered 2014-12-16: 4 mg via ORAL

## 2014-12-16 MED ORDER — NORGESTREL-ETHINYL ESTRADIOL 0.3-30 MG-MCG PO TABS: 1.0000 | ORAL_TABLET | Freq: Every day | ORAL | Status: DC

## 2014-12-16 NOTE — ED Notes (Signed)
Pt thought that she might be pregnant, because of all her symptoms. Once the test was done pt is asking everyone if she is pregnant.

## 2014-12-16 NOTE — ED Provider Notes (Signed)
[facility] Emergency Department Provider Note     Time seen: ----------------------------------------- 7:16 PM on 12/16/2014 -----------------------------------------    I have reviewed the triage vital signs and the nursing notes.   HISTORY  Chief Complaint Abdominal Pain    HPI Brandy Luna is a 29 y.o. female who presents ER for feeling weak and nauseated and having lower abdominal pelvic pain, as well as she's had light vaginal bleeding for last month. Her also her breasts are hurting. Pain is severe in the pelvis. Nothing makes it better or worse. Lambert ModySharp    Past Medical History  Diagnosis Date  . Pneumonia   . Anemia     There are no active problems to display for this patient.   Past Surgical History  Procedure Laterality Date  . Tonsillectomy      Current Outpatient Rx  Name  Route  Sig  Dispense  Refill  . ibuprofen (ADVIL,MOTRIN) 600 MG tablet   Oral   Take 600 mg by mouth every 6 (six) hours as needed. For pain         . medroxyPROGESTERone (PROVERA) 5 MG tablet   Oral   Take 1 tablet (5 mg total) by mouth daily.   5 tablet   0   . ondansetron (ZOFRAN) 4 MG tablet   Oral   Take 1 tablet (4 mg total) by mouth every 8 (eight) hours as needed for nausea.   20 tablet   0     Allergies Latex and Zithromax  No family history on file.  Social History History  Substance Use Topics  . Smoking status: Current Some Day Smoker  . Smokeless tobacco: Not on file  . Alcohol Use: No    Review of Systems Constitutional: Negative for fever. Eyes: Negative for visual changes. ENT: Negative for sore throat. Cardiovascular: Negative for chest pain. Respiratory: Negative for shortness of breath. Gastrointestinal: Positive for abdominal pain, vomiting and diarrhea. Genitourinary: Negative for dysuria, positive for pelvic and vaginal pain Musculoskeletal: Negative for back pain. Skin: Negative for rash. Neurological:  Negative for headaches, focal weakness or numbness.  10-point ROS otherwise negative.  ____________________________________________   PHYSICAL EXAM:  VITAL SIGNS: ED Triage Vitals  Enc Vitals Group     BP 12/16/14 1758 113/67 mmHg     Pulse Rate 12/16/14 1758 89     Resp 12/16/14 1758 18     Temp 12/16/14 1758 97.9 F (36.6 C)     Temp Source 12/16/14 1758 Oral     SpO2 12/16/14 1758 100 %     Weight 12/16/14 1758 138 lb (62.596 kg)     Height 12/16/14 1758 5\' 4"  (1.626 m)     Head Cir --      Peak Flow --      Pain Score 12/16/14 1758 9     Pain Loc --      Pain Edu? --      Excl. in GC? --     Constitutional: Alert and oriented. Mild distress Eyes: Conjunctivae are normal. PERRL. Normal extraocular movements. ENT   Head: Normocephalic and atraumatic.   Nose: No congestion/rhinnorhea.   Mouth/Throat: Mucous membranes are moist.   Neck: No stridor. Hematological/Lymphatic/Immunilogical: No cervical lymphadenopathy. Cardiovascular: Normal rate, regular rhythm. Normal and symmetric distal pulses are present in all extremities. No murmurs, rubs, or gallops. Respiratory: Normal respiratory effort without tachypnea nor retractions. Breath sounds are clear and equal bilaterally. No wheezes/rales/rhonchi. Gastrointestinal: Soft and nontender. No distention. No abdominal  bruits. There is no CVA tenderness. Musculoskeletal: Nontender with normal range of motion in all extremities. No joint effusions.  No lower extremity tenderness nor edema. Neurologic:  Normal speech and language. No gross focal neurologic deficits are appreciated. Speech is normal. No gait instability. Skin:  Skin is warm, dry and intact. No rash noted. Psychiatric: Mood and affect are normal. Speech and behavior are normal. Patient exhibits appropriate insight and judgment.  ____________________________________________    LABS (pertinent positives/negatives)  Labs Reviewed  WET PREP, GENITAL -  Abnormal; Notable for the following:    Yeast Wet Prep HPF POC NONE SEEN (*)    Trich, Wet Prep PRESENT (*)    Clue Cells Wet Prep HPF POC NONE SEEN (*)    WBC, Wet Prep HPF POC FEW (*)    All other components within normal limits  COMPREHENSIVE METABOLIC PANEL - Abnormal; Notable for the following:    Total Protein 8.2 (*)    All other components within normal limits  CBC WITH DIFFERENTIAL/PLATELET - Abnormal; Notable for the following:    MCHC 31.9 (*)    All other components within normal limits  URINALYSIS COMPLETEWITH MICROSCOPIC ([facility])  - Abnormal; Notable for the following:    Color, Urine AMBER (*)    APPearance HAZY (*)    Bilirubin Urine 1+ (*)    Hgb urine dipstick 3+ (*)    Protein, ur 30 (*)    Leukocytes, UA TRACE (*)    Squamous Epithelial / LPF 6-30 (*)    All other components within normal limits  CHLAMYDIA/NGC RT PCR ([facility])   LIPASE, BLOOD  POC URINE PREG, ED  POCT PREGNANCY, URINE    ____________________________________________  ED COURSE:  Pertinent labs & imaging results that were available during my care of the patient were reviewed by me and considered in my medical decision making (see chart for details).  Patient will need a pelvic exam, basic labs and reevaluation. ____________________________________________   RADIOLOGY  None  ____________________________________________    FINAL ASSESSMENT AND PLAN  Abnormal vaginal bleeding and trichomoniasis  Plan: Patient will be treated with Flagyl 2 g by mouth here for trichomoniasis and I will start her on birth control help better regulate her menstrual cycles. She is stable for outpatient follow-up    Emily FilbertWilliams, Jonathan E, MD   Emily FilbertJonathan E Williams, MD 12/16/14 [phone]

## 2014-12-16 NOTE — ED Notes (Signed)
Pt reports that she feels weak, nauseated, lower abd pain and her breast are hurting for the last few weeks.

## 2014-12-16 NOTE — Discharge Instructions (Signed)
Abnormal Uterine Bleeding °Abnormal uterine bleeding can affect women at various stages in life, including teenagers, women in their reproductive years, pregnant women, and women who have reached menopause. Several kinds of uterine bleeding are considered abnormal, including: °· Bleeding or spotting between periods.   °· Bleeding after sexual intercourse.   °· Bleeding that is heavier or more than normal.   °· Periods that last longer than usual. °· Bleeding after menopause.   °Many cases of abnormal uterine bleeding are minor and simple to treat, while others are more serious. Any type of abnormal bleeding should be evaluated by your health care provider. Treatment will depend on the cause of the bleeding. °HOME CARE INSTRUCTIONS °Monitor your condition for any changes. The following actions may help to alleviate any discomfort you are experiencing: °· Avoid the use of tampons and douches as directed by your health care provider. °· Change your pads frequently. °You should get regular pelvic exams and Pap tests. Keep all follow-up appointments for diagnostic tests as directed by your health care provider.  °SEEK MEDICAL CARE IF:  °· Your bleeding lasts more than 1 week.   °· You feel dizzy at times.   °SEEK IMMEDIATE MEDICAL CARE IF:  °· You pass out.   °· You are changing pads every 15 to 30 minutes.   °· You have abdominal pain. °· You have a fever.   °· You become sweaty or weak.   °· You are passing large blood clots from the vagina.   °· You start to feel nauseous and vomit. °MAKE SURE YOU:  °· Understand these instructions. °· Will watch your condition. °· Will get help right away if you are not doing well or get worse. °Document Released: 07/16/2005 Document Revised: 07/21/2013 Document Reviewed: 02/12/2013 °ExitCare® Patient Information ©2015 [employer]. This information is not intended to replace advice given to you by your health care provider. Make sure you discuss any questions you have with your  health care provider. ° ° °Trichomoniasis °Trichomoniasis is an infection caused by an organism called Trichomonas. The infection can affect both women and men. In women, the outer female genitalia and the vagina are affected. In men, the penis is mainly affected, but the prostate and other reproductive organs can also be involved. Trichomoniasis is a sexually transmitted infection (STI) and is most often passed to another person through sexual contact.  °RISK FACTORS °· Having unprotected sexual intercourse. °· Having sexual intercourse with an infected partner. °SIGNS AND SYMPTOMS  °Symptoms of trichomoniasis in women include: °· Abnormal gray-green frothy vaginal discharge. °· Itching and irritation of the vagina. °· Itching and irritation of the area outside the vagina. °Symptoms of trichomoniasis in men include:  °· Penile discharge with or without pain. °· Pain during urination. This results from inflammation of the urethra. °DIAGNOSIS  °Trichomoniasis may be found during a Pap test or physical exam. Your health care provider may use one of the following methods to help diagnose this infection: °· Examining vaginal discharge under a microscope. For men, urethral discharge would be examined. °· Testing the pH of the vagina with a test tape. °· Using a vaginal swab test that checks for the Trichomonas organism. A test is available that provides results within a few minutes. °· Doing a culture test for the organism. This is not usually needed. °TREATMENT  °· You may be given medicine to fight the infection. Women should inform their health care provider if they could be or are pregnant. Some medicines used to treat the infection should not be taken during   pregnancy. °· Your health care provider may recommend over-the-counter medicines or creams to decrease itching or irritation. °· Your sexual partner will need to be treated if infected. °HOME CARE INSTRUCTIONS  °· Take medicines only as directed by your health  care provider. °· Take over-the-counter medicine for itching or irritation as directed by your health care provider. °· Do not have sexual intercourse while you have the infection. °· Women should not douche or wear tampons while they have the infection. °· Discuss your infection with your partner. Your partner may have gotten the infection from you, or you may have gotten it from your partner. °· Have your sex partner get examined and treated if necessary. °· Practice safe, informed, and protected sex. °· See your health care provider for other STI testing. °SEEK MEDICAL CARE IF:  °· You still have symptoms after you finish your medicine. °· You develop abdominal pain. °· You have pain when you urinate. °· You have bleeding after sexual intercourse. °· You develop a rash. °· Your medicine makes you sick or makes you throw up (vomit). °MAKE SURE YOU: °· Understand these instructions. °· Will watch your condition. °· Will get help right away if you are not doing well or get worse. °Document Released: 01/09/2001 Document Revised: 11/30/2013 Document Reviewed: 04/27/2013 °ExitCare® Patient Information ©2015 [employer]. This information is not intended to replace advice given to you by your health care provider. Make sure you discuss any questions you have with your health care provider. ° °

## 2015-01-27 ENCOUNTER — Encounter: Payer: Self-pay | Admitting: Emergency Medicine

## 2015-01-27 ENCOUNTER — Emergency Department
Admission: EM | Admit: 2015-01-27 | Discharge: 2015-01-27 | Disposition: A | Discharge status: Home / Self Care | Payer: Medicaid Other | Attending: Emergency Medicine | Admitting: Emergency Medicine

## 2015-01-27 DIAGNOSIS — Z793 Long term (current) use of hormonal contraceptives: Secondary | ICD-10-CM | POA: Insufficient documentation

## 2015-01-27 DIAGNOSIS — Z91040 Latex allergy status: Secondary | ICD-10-CM | POA: Insufficient documentation

## 2015-01-27 DIAGNOSIS — R112 Nausea with vomiting, unspecified: Secondary | ICD-10-CM | POA: Insufficient documentation

## 2015-01-27 DIAGNOSIS — Z3202 Encounter for pregnancy test, result negative: Secondary | ICD-10-CM | POA: Insufficient documentation

## 2015-01-27 DIAGNOSIS — R197 Diarrhea, unspecified: Secondary | ICD-10-CM | POA: Insufficient documentation

## 2015-01-27 DIAGNOSIS — Z720 Tobacco use: Secondary | ICD-10-CM | POA: Insufficient documentation

## 2015-01-27 DIAGNOSIS — R509 Fever, unspecified: Secondary | ICD-10-CM | POA: Insufficient documentation

## 2015-01-27 DIAGNOSIS — Z7952 Long term (current) use of systemic steroids: Secondary | ICD-10-CM | POA: Insufficient documentation

## 2015-01-27 LAB — URINALYSIS COMPLETE WITH MICROSCOPIC (ARMC ONLY)
BACTERIA UA: NONE SEEN
Bilirubin Urine: NEGATIVE
GLUCOSE, UA: NEGATIVE mg/dL
Hgb urine dipstick: NEGATIVE
Ketones, ur: NEGATIVE mg/dL
Leukocytes, UA: NEGATIVE
NITRITE: NEGATIVE
PH: 8 (ref 5.0–8.0)
Protein, ur: NEGATIVE mg/dL
Specific Gravity, Urine: 1.012 (ref 1.005–1.030)

## 2015-01-27 LAB — COMPREHENSIVE METABOLIC PANEL
ALK PHOS: 59 U/L (ref 38–126)
ALT: 11 U/L — AB (ref 14–54)
ANION GAP: 11 (ref 5–15)
AST: 21 U/L (ref 15–41)
Albumin: 4.1 g/dL (ref 3.5–5.0)
BUN: 8 mg/dL (ref 6–20)
CO2: 25 mmol/L (ref 22–32)
Calcium: 9.2 mg/dL (ref 8.9–10.3)
Chloride: 103 mmol/L (ref 101–111)
Creatinine, Ser: 0.62 mg/dL (ref 0.44–1.00)
GFR calc Af Amer: >60 mL/min (ref >60)
GFR calc non Af Amer: >60 mL/min (ref >60)
Glucose, Bld: 81 mg/dL (ref 65–99)
Potassium: 3.7 mmol/L (ref 3.5–5.1)
Sodium: 139 mmol/L (ref 135–145)
Total Bilirubin: 0.4 mg/dL (ref 0.3–1.2)
Total Protein: 7.8 g/dL (ref 6.5–8.1)

## 2015-01-27 LAB — CBC WITH DIFFERENTIAL/PLATELET
Basophils Absolute: 0.1 10*3/uL (ref 0–0.1)
Basophils Relative: 1 %
Eosinophils Absolute: 0.3 10*3/uL (ref 0–0.7)
Eosinophils Relative: 3 %
HEMATOCRIT: 41.6 % (ref 35.0–47.0)
Hemoglobin: 13.2 g/dL (ref 12.0–16.0)
Lymphocytes Relative: 18 %
Lymphs Abs: 1.7 10*3/uL (ref 1.0–3.6)
MCH: 26.1 pg (ref 26.0–34.0)
MCHC: 31.6 g/dL — ABNORMAL LOW (ref 32.0–36.0)
MCV: 82.6 fL (ref 80.0–100.0)
Monocytes Absolute: 0.7 10*3/uL (ref 0.2–0.9)
Monocytes Relative: 7 %
Neutro Abs: 6.8 10*3/uL — ABNORMAL HIGH (ref 1.4–6.5)
Neutrophils Relative %: 71 %
Platelets: 207 10*3/uL (ref 150–440)
RBC: 5.04 MIL/uL (ref 3.80–5.20)
RDW: 15.2 % — ABNORMAL HIGH (ref 11.5–14.5)
WBC: 9.6 10*3/uL (ref 3.6–11.0)

## 2015-01-27 LAB — LIPASE, BLOOD: Lipase: 51 U/L (ref 22–51)

## 2015-01-27 LAB — POCT PREGNANCY, URINE: Preg Test, Ur: NEGATIVE

## 2015-01-27 MED ORDER — ACETAMINOPHEN 500 MG PO TABS
ORAL_TABLET | ORAL | Status: AC
  Filled 2015-01-27: qty 2

## 2015-01-27 MED ORDER — ACETAMINOPHEN 500 MG PO TABS
1000.0000 mg | ORAL_TABLET | Freq: Once | ORAL | Status: AC
  Administered 2015-01-27: 1000 mg via ORAL

## 2015-01-27 MED ORDER — ONDANSETRON HCL 4 MG/2ML IJ SOLN
INTRAMUSCULAR | Status: AC
  Administered 2015-01-27: 4 mg via INTRAVENOUS
  Filled 2015-01-27: qty 2

## 2015-01-27 MED ORDER — ONDANSETRON HCL 4 MG PO TABS: 4.0000 mg | ORAL_TABLET | Freq: Every day | ORAL | Status: AC | PRN

## 2015-01-27 MED ORDER — ONDANSETRON HCL 4 MG/2ML IJ SOLN
4.0000 mg | Freq: Once | INTRAMUSCULAR | Status: AC
  Administered 2015-01-27: 4 mg via INTRAVENOUS

## 2015-01-27 MED ORDER — SODIUM CHLORIDE 0.9 % IV BOLUS (SEPSIS)
1000.0000 mL | Freq: Once | INTRAVENOUS | Status: AC
  Administered 2015-01-27: 1000 mL via INTRAVENOUS

## 2015-01-27 NOTE — Discharge Instructions (Signed)
Please seek medical attention for any high fevers, chest pain, shortness of breath, change in behavior, persistent vomiting, bloody stool or any other new or concerning symptoms. ° ° °Nausea and Vomiting °Nausea is a sick feeling that often comes before throwing up (vomiting). Vomiting is a reflex where stomach contents come out of your mouth. Vomiting can cause severe loss of body fluids (dehydration). Children and elderly adults can become dehydrated quickly, especially if they also have diarrhea. Nausea and vomiting are symptoms of a condition or disease. It is important to find the cause of your symptoms. °CAUSES  °· Direct irritation of the stomach lining. This irritation can result from increased acid production (gastroesophageal reflux disease), infection, food poisoning, taking certain medicines (such as nonsteroidal anti-inflammatory drugs), alcohol use, or tobacco use. °· Signals from the brain. These signals could be caused by a headache, heat exposure, an inner ear disturbance, increased pressure in the brain from injury, infection, a tumor, or a concussion, pain, emotional stimulus, or metabolic problems. °· An obstruction in the gastrointestinal tract (bowel obstruction). °· Illnesses such as diabetes, hepatitis, gallbladder problems, appendicitis, kidney problems, cancer, sepsis, atypical symptoms of a heart attack, or eating disorders. °· Medical treatments such as chemotherapy and radiation. °· Receiving medicine that makes you sleep (general anesthetic) during surgery. °DIAGNOSIS °Your caregiver may ask for tests to be done if the problems do not improve after a few days. Tests may also be done if symptoms are severe or if the reason for the nausea and vomiting is not clear. Tests may include: °· Urine tests. °· Blood tests. °· Stool tests. °· Cultures (to look for evidence of infection). °· X-rays or other imaging studies. °Test results can help your caregiver make decisions about treatment or the  need for additional tests. °TREATMENT °You need to stay well hydrated. Drink frequently but in small amounts. You may wish to drink water, sports drinks, clear broth, or eat frozen ice pops or gelatin dessert to help stay hydrated. When you eat, eating slowly may help prevent nausea. There are also some antinausea medicines that may help prevent nausea. °HOME CARE INSTRUCTIONS  °· Take all medicine as directed by your caregiver. °· If you do not have an appetite, do not force yourself to eat. However, you must continue to drink fluids. °· If you have an appetite, eat a normal diet unless your caregiver tells you differently. °¨ Eat a variety of complex carbohydrates (rice, wheat, potatoes, bread), lean meats, yogurt, fruits, and vegetables. °¨ Avoid high-fat foods because they are more difficult to digest. °· Drink enough water and fluids to keep your urine clear or pale yellow. °· If you are dehydrated, ask your caregiver for specific rehydration instructions. Signs of dehydration may include: °¨ Severe thirst. °¨ Dry lips and mouth. °¨ Dizziness. °¨ Dark urine. °¨ Decreasing urine frequency and amount. °¨ Confusion. °¨ Rapid breathing or pulse. °SEEK IMMEDIATE MEDICAL CARE IF:  °· You have blood or brown flecks (like coffee grounds) in your vomit. °· You have black or bloody stools. °· You have a severe headache or stiff neck. °· You are confused. °· You have severe abdominal pain. °· You have chest pain or trouble breathing. °· You do not urinate at least once every 8 hours. °· You develop cold or clammy skin. °· You continue to vomit for longer than 24 to 48 hours. °· You have a fever. °MAKE SURE YOU:  °· Understand these instructions. °· Will watch your condition. °· Will get help right away   if you are not doing well or get worse. °Document Released: 07/16/2005 Document Revised: 10/08/2011 Document Reviewed: 12/13/2010 °ExitCare® Patient Information ©2015 [employer]. This information is not intended to  replace advice given to you by your health care provider. Make sure you discuss any questions you have with your health care provider. ° °

## 2015-01-27 NOTE — ED Notes (Signed)
Pt. States bodyaches, HA and has vomited 3x today.  Pt. Denies anyone in household with same symptoms.  Pt. States taking tylenol today but vomited shortly afterwards.

## 2015-01-27 NOTE — ED Notes (Signed)
Patient c/o headache, vomiting and generalized body aches. Symptoms started today.

## 2015-01-27 NOTE — ED Provider Notes (Signed)
[facility] Emergency Department Provider Note   ____________________________________________  Time seen: 2130  I have reviewed the triage vital signs and the nursing notes.   HISTORY  Chief Complaint Emesis   History limited by: Not Limited   HPI Brandy Luna is a 29 y.o. female who presents to the emergency Department with complaints of nausea, vomiting, diarrhea, body aches and fever. She states that the symptoms started this afternoon. Earlier today she was feeling fine. The symptoms have gradually gotten worse. She denies any sick contacts. Denies any unusual food. States she has had similar symptoms in the past with urinary tract infection.     Past Medical History  Diagnosis Date  . Pneumonia   . Anemia     There are no active problems to display for this patient.   Past Surgical History  Procedure Laterality Date  . Tonsillectomy    . Cesarean section      Current Outpatient Rx  Name  Route  Sig  Dispense  Refill  . ibuprofen (ADVIL,MOTRIN) 600 MG tablet   Oral   Take 600 mg by mouth every 6 (six) hours as needed. For pain         . medroxyPROGESTERone (PROVERA) 5 MG tablet   Oral   Take 1 tablet (5 mg total) by mouth daily.   5 tablet   0   . norgestrel-ethinyl estradiol (LO/OVRAL,CRYSELLE) 0.3-30 MG-MCG tablet   Oral   Take 1 tablet by mouth daily.   1 Package   11   . ondansetron (ZOFRAN) 4 MG tablet   Oral   Take 1 tablet (4 mg total) by mouth every 8 (eight) hours as needed for nausea.   20 tablet   0     Allergies Latex and Zithromax  No family history on file.  Social History History  Substance Use Topics  . Smoking status: Current Some Day Smoker  . Smokeless tobacco: Not on file  . Alcohol Use: No    Review of Systems  Constitutional: Subjective fever. Cardiovascular: Negative for chest pain. Respiratory: Negative for shortness of breath. Gastrointestinal: Positive for abdominal pain,  vomiting and diarrhea. Genitourinary: Negative for dysuria. Musculoskeletal: Negative for back pain. Skin: Negative for rash. Neurological: Negative for headaches, focal weakness or numbness.   10-point ROS otherwise negative.  ____________________________________________   PHYSICAL EXAM:  VITAL SIGNS: ED Triage Vitals  Enc Vitals Group     BP 01/27/15 2049 126/82 mmHg     Pulse Rate 01/27/15 2049 79     Resp 01/27/15 2049 16     Temp 01/27/15 2049 98 F (36.7 C)     Temp Source 01/27/15 2049 Oral     SpO2 01/27/15 2049 100 %     Weight 01/27/15 2049 138 lb (62.596 kg)     Height 01/27/15 2049 5\' 4"  (1.626 m)     Head Cir --      Peak Flow --      Pain Score 01/27/15 2050 9   Constitutional: Alert and oriented. Well appearing and in no distress. Eyes: Conjunctivae are normal. PERRL. Normal extraocular movements. ENT   Head: Normocephalic and atraumatic.   Nose: No congestion/rhinnorhea.   Mouth/Throat: Mucous membranes are moist.   Neck: No stridor. Hematological/Lymphatic/Immunilogical: No cervical lymphadenopathy. Cardiovascular: Normal rate, regular rhythm.  No murmurs, rubs, or gallops. Respiratory: Normal respiratory effort without tachypnea nor retractions. Breath sounds are clear and equal bilaterally. No wheezes/rales/rhonchi. Gastrointestinal: Soft and diffusely minimally tender to palpation.  No distention. There is no CVA tenderness. Genitourinary: Deferred Musculoskeletal: Normal range of motion in all extremities. No joint effusions.  No lower extremity tenderness nor edema. Neurologic:  Normal speech and language. No gross focal neurologic deficits are appreciated. Speech is normal.  Skin:  Skin is warm, dry and intact. No rash noted. Psychiatric: Mood and affect are normal. Speech and behavior are normal. Patient exhibits appropriate insight and judgment.  ____________________________________________    LABS (pertinent  positives/negatives)  Labs Reviewed  CBC WITH DIFFERENTIAL/PLATELET - Abnormal; Notable for the following:    MCHC 31.6 (*)    RDW 15.2 (*)    Neutro Abs 6.8 (*)    All other components within normal limits  COMPREHENSIVE METABOLIC PANEL - Abnormal; Notable for the following:    ALT 11 (*)    All other components within normal limits  URINALYSIS COMPLETEWITH MICROSCOPIC ([facility] ONLY) - Abnormal; Notable for the following:    Color, Urine YELLOW (*)    APPearance CLEAR (*)    Squamous Epithelial / LPF 0-5 (*)    All other components within normal limits  LIPASE, BLOOD  POC URINE PREG, ED  POCT PREGNANCY, URINE     ____________________________________________   EKG  None  ____________________________________________    RADIOLOGY  None  ____________________________________________   PROCEDURES  Procedure(s) performed: None  Critical Care performed: No  ____________________________________________   INITIAL IMPRESSION / ASSESSMENT AND PLAN / ED COURSE  Pertinent labs & imaging results that were available during my care of the patient were reviewed by me and considered in my medical decision making (see chart for details).  Patient presents with tooth of vomiting, diarrhea, some abdominal pain, generalized body aches and fever. Nasal exam with some mild diffuse abdominal tenderness. Blood work without any concerning findings. Will give fluids and antiemetics and reassess.  ____________________________________________   FINAL CLINICAL IMPRESSION(S) / ED DIAGNOSES  Final diagnoses:  Nausea and vomiting, vomiting of unspecified type     Phineas Semen, MD 01/27/15 2206

## 2015-08-14 ENCOUNTER — Emergency Department
Admission: EM | Admit: 2015-08-14 | Discharge: 2015-08-14 | Disposition: A | Discharge status: Home / Self Care | Payer: Self-pay | Attending: Emergency Medicine | Admitting: Emergency Medicine

## 2015-08-14 DIAGNOSIS — Z79899 Other long term (current) drug therapy: Secondary | ICD-10-CM | POA: Insufficient documentation

## 2015-08-14 DIAGNOSIS — F17200 Nicotine dependence, unspecified, uncomplicated: Secondary | ICD-10-CM | POA: Insufficient documentation

## 2015-08-14 DIAGNOSIS — Z91040 Latex allergy status: Secondary | ICD-10-CM | POA: Insufficient documentation

## 2015-08-14 DIAGNOSIS — N72 Inflammatory disease of cervix uteri: Secondary | ICD-10-CM | POA: Insufficient documentation

## 2015-08-14 LAB — URINALYSIS COMPLETE WITH MICROSCOPIC (ARMC ONLY)
BACTERIA UA: NONE SEEN
Bilirubin Urine: NEGATIVE
Glucose, UA: NEGATIVE mg/dL
Ketones, ur: NEGATIVE mg/dL
Nitrite: NEGATIVE
Protein, ur: NEGATIVE mg/dL
Specific Gravity, Urine: 1.028 (ref 1.005–1.030)
pH: 5 (ref 5.0–8.0)

## 2015-08-14 LAB — CBC
HCT: 40.7 % (ref 35.0–47.0)
HEMOGLOBIN: 13 g/dL (ref 12.0–16.0)
MCH: 26.3 pg (ref 26.0–34.0)
MCHC: 31.9 g/dL — ABNORMAL LOW (ref 32.0–36.0)
MCV: 82.4 fL (ref 80.0–100.0)
Platelets: 213 10*3/uL (ref 150–440)
RBC: 4.94 MIL/uL (ref 3.80–5.20)
RDW: 14.1 % (ref 11.5–14.5)
WBC: 7.1 10*3/uL (ref 3.6–11.0)

## 2015-08-14 LAB — COMPREHENSIVE METABOLIC PANEL
ALBUMIN: 3.9 g/dL (ref 3.5–5.0)
ALK PHOS: 57 U/L (ref 38–126)
ALT: 14 U/L (ref 14–54)
ANION GAP: 5 (ref 5–15)
AST: 18 U/L (ref 15–41)
BUN: 10 mg/dL (ref 6–20)
CALCIUM: 8.8 mg/dL — AB (ref 8.9–10.3)
CO2: 28 mmol/L (ref 22–32)
Chloride: 105 mmol/L (ref 101–111)
Creatinine, Ser: 0.58 mg/dL (ref 0.44–1.00)
GFR calc Af Amer: >60 mL/min (ref >60)
GFR calc non Af Amer: >60 mL/min (ref >60)
Glucose, Bld: 82 mg/dL (ref 65–99)
Potassium: 3.6 mmol/L (ref 3.5–5.1)
SODIUM: 138 mmol/L (ref 135–145)
Total Bilirubin: 0.7 mg/dL (ref 0.3–1.2)
Total Protein: 7.8 g/dL (ref 6.5–8.1)

## 2015-08-14 LAB — WET PREP, GENITAL
Clue Cells Wet Prep HPF POC: NONE SEEN
Sperm: NONE SEEN
Trich, Wet Prep: NONE SEEN
Yeast Wet Prep HPF POC: NONE SEEN

## 2015-08-14 LAB — PREGNANCY, URINE: Preg Test, Ur: NEGATIVE

## 2015-08-14 LAB — CHLAMYDIA/NGC RT PCR (ARMC ONLY)
Chlamydia Tr: NOT DETECTED
N gonorrhoeae: NOT DETECTED

## 2015-08-14 MED ORDER — CEFTRIAXONE SODIUM 250 MG IJ SOLR
250.0000 mg | Freq: Once | INTRAMUSCULAR | Status: AC
  Administered 2015-08-14: 250 mg via INTRAMUSCULAR
  Filled 2015-08-14: qty 250

## 2015-08-14 MED ORDER — TRAMADOL HCL 50 MG PO TABS: 50.0000 mg | ORAL_TABLET | Freq: Four times a day (QID) | ORAL | Status: DC | PRN

## 2015-08-14 MED ORDER — DOXYCYCLINE HYCLATE 100 MG PO TABS
ORAL_TABLET | ORAL | Status: AC
  Filled 2015-08-14: qty 1

## 2015-08-14 MED ORDER — DOXYCYCLINE HYCLATE 100 MG PO CAPS: 100.0000 mg | ORAL_CAPSULE | Freq: Two times a day (BID) | ORAL | Status: AC

## 2015-08-14 MED ORDER — DOXYCYCLINE HYCLATE 100 MG PO TABS
100.0000 mg | ORAL_TABLET | Freq: Once | ORAL | Status: AC
  Administered 2015-08-14: 100 mg via ORAL
  Filled 2015-08-14: qty 1

## 2015-08-14 NOTE — ED Notes (Signed)
Pt states has lower abd pain, "smelly pee', low back pain and intermittent chills. Pt denies nausea, vomiting, diarrhea. Pt states has vaginal discharge that is thick white and cloudy.

## 2015-08-14 NOTE — ED Notes (Signed)
Pt. States lower abdominal for the past 3 days.  Pt. Denies vomiting or diarrhea.  Pt. States dysuria a couple days ago.

## 2015-08-14 NOTE — Discharge Instructions (Signed)
Cervicitis °Cervicitis is a soreness and swelling (inflammation) of the cervix. Your cervix is located at the bottom of your uterus. It opens up to the vagina. °CAUSES  °· Sexually transmitted infections (STIs).   °· Allergic reaction.   °· Medicines or birth control devices that are put in the vagina.   °· Injury to the cervix.   °· Bacterial infections.   °RISK FACTORS °You are at greater risk if you: °· Have unprotected sexual intercourse. °· Have sexual intercourse with many partners. °· Began sexual intercourse at an early age. °· Have a history of STIs. °SYMPTOMS  °There may be no symptoms. If symptoms occur, they may include:  °· Gray, white, yellow, or bad-smelling vaginal discharge.   °· Pain or itching of the area outside the vagina.   °· Painful sexual intercourse.   °· Lower abdominal or lower back pain, especially during intercourse.   °· Frequent urination.   °· Abnormal vaginal bleeding between periods, after sexual intercourse, or after menopause.   °· Pressure or a heavy feeling in the pelvis.   °DIAGNOSIS  °Diagnosis is made after a pelvic exam. Other tests may include:  °· Examination of any discharge under a microscope (wet prep).   °· A Pap test.   °TREATMENT  °Treatment will depend on the cause of cervicitis. If it is caused by an STI, both you and your partner will need to be treated. Antibiotic medicines will be given.  °HOME CARE INSTRUCTIONS  °· Do not have sexual intercourse until your health care provider says it is okay.   °· Do not have sexual intercourse until your partner has been treated, if your cervicitis is caused by an STI.   °· Take your antibiotics as directed. Finish them even if you start to feel better.   °SEEK MEDICAL CARE IF: °· Your symptoms come back.   °· You have a fever.   °MAKE SURE YOU:  °· Understand these instructions. °· Will watch your condition. °· Will get help right away if you are not doing well or get worse. °  °This information is not intended to replace  advice given to you by your health care provider. Make sure you discuss any questions you have with your health care provider. °  °Document Released: 07/16/2005 Document Revised: 07/21/2013 Document Reviewed: 01/07/2013 °Elsevier Interactive Patient Education ©2016 [employer] ° °

## 2015-08-14 NOTE — ED Notes (Signed)
Pt. Also states white vaginal discharge that started about 5 days ago.

## 2015-08-14 NOTE — ED Provider Notes (Signed)
[facility] Emergency Department Provider Note  ____________________________________________  Time seen: On arrival  I have reviewed the triage vital signs and the nursing notes.   HISTORY  Chief Complaint Pelvic pain   HPI Brandy Luna is a 30 y.o. female who presents with pelvic discomfort for the past 3 days. She notes vaginal discharge as well. She is also had some mild dysuria and foul smelling urine. She denies fevers or chills. She does report unprotected sexual intercourse. She does report a history of STDs. She denies back pain to me. No vomiting or diarrhea.     Past Medical History  Diagnosis Date  . Pneumonia   . Anemia     There are no active problems to display for this patient.   Past Surgical History  Procedure Laterality Date  . Tonsillectomy    . Cesarean section      Current Outpatient Rx  Name  Route  Sig  Dispense  Refill  . ibuprofen (ADVIL,MOTRIN) 600 MG tablet   Oral   Take 600 mg by mouth every 6 (six) hours as needed. For pain         . medroxyPROGESTERone (PROVERA) 5 MG tablet   Oral   Take 1 tablet (5 mg total) by mouth daily.   5 tablet   0   . norgestrel-ethinyl estradiol (LO/OVRAL,CRYSELLE) 0.3-30 MG-MCG tablet   Oral   Take 1 tablet by mouth daily.   1 Package   11   . ondansetron (ZOFRAN) 4 MG tablet   Oral   Take 1 tablet (4 mg total) by mouth daily as needed for nausea or vomiting.   20 tablet   0     Allergies Latex and Zithromax  No family history on file.  Social History Social History  Substance Use Topics  . Smoking status: Current Some Day Smoker  . Smokeless tobacco: Not on file  . Alcohol Use: No    Review of Systems  Constitutional: Negative for fever. Eyes: Negative for redness ENT: Negative for sore throat Cardiovascular: Negative for chest pain. Respiratory: Negative for shortness of breath. Gastrointestinal: Negative for vomiting and diarrhea. Genitourinary:  Positive for dysuria. Positive for pelvic pain Musculoskeletal: Negative for back pain. Skin: Negative for rash. Neurological: Negative for headaches     ____________________________________________   PHYSICAL EXAM:  VITAL SIGNS: ED Triage Vitals  Enc Vitals Group     BP 08/14/15 0058 129/88 mmHg     Pulse Rate 08/14/15 0058 76     Resp 08/14/15 0058 14     Temp 08/14/15 0058 97.6 F (36.4 C)     Temp Source 08/14/15 0058 Oral     SpO2 08/14/15 0058 100 %     Weight 08/14/15 0058 148 lb (67.132 kg)     Height 08/14/15 0058 5\' 4"  (1.626 m)     Head Cir --      Peak Flow --      Pain Score 08/14/15 0059 8     Pain Loc --      Pain Edu? --      Excl. in GC? --      Constitutional: Alert and oriented. Well appearing and in no distress. Eyes: Conjunctivae are normal.  ENT   Head: Normocephalic and atraumatic.   Mouth/Throat: Mucous membranes are moist. Cardiovascular: Normal rate, regular rhythm.  Respiratory: Normal respiratory effort without tachypnea nor retractions. Gastrointestinal: Soft and non-tender in all quadrants. No distention. There is no CVA tenderness. Genitourinary: Positive  cervicitis and mucousy white discharge noted, no CMT Musculoskeletal:  No lower extremity tenderness nor edema. Neurologic:  Normal speech and language. No gross focal neurologic deficits are appreciated. Skin:  Skin is warm, dry and intact. No rash noted. Psychiatric: Mood and affect are normal. Patient exhibits appropriate insight and judgment.  ____________________________________________    LABS (pertinent positives/negatives)  Labs Reviewed  WET PREP, GENITAL - Abnormal; Notable for the following:    WBC, Wet Prep HPF POC MODERATE (*)    All other components within normal limits  COMPREHENSIVE METABOLIC PANEL - Abnormal; Notable for the following:    Calcium 8.8 (*)    All other components within normal limits  CBC - Abnormal; Notable for the following:    MCHC  31.9 (*)    All other components within normal limits  URINALYSIS COMPLETEWITH MICROSCOPIC ([facility] ONLY) - Abnormal; Notable for the following:    Color, Urine YELLOW (*)    APPearance CLOUDY (*)    Hgb urine dipstick 1+ (*)    Leukocytes, UA 2+ (*)    Squamous Epithelial / LPF 6-30 (*)    All other components within normal limits  CHLAMYDIA/NGC RT PCR ([facility] ONLY)  PREGNANCY, URINE    ____________________________________________   EKG  None  ____________________________________________    RADIOLOGY I have personally reviewed any xrays that were ordered on this patient: None  ____________________________________________   PROCEDURES  Procedure(s) performed: none  Critical Care performed: none  ____________________________________________   INITIAL IMPRESSION / ASSESSMENT AND PLAN / ED COURSE  Pertinent labs & imaging results that were available during my care of the patient were reviewed by me and considered in my medical decision making (see chart for details).  Patient's exam concerning for cervicitis, GC and chlamydia sent that we will treat given her discomfort and appearance of the cervix. Urinalysis appears contaminated. Patient is allergic to azithromycin so we'll discharge her with doxycycline and I emphasized need for compliance  ____________________________________________   FINAL CLINICAL IMPRESSION(S) / ED DIAGNOSES  Final diagnoses:  Cervicitis     Jene Everyobert Shontia Gillooly, MD 08/14/15 [phone]

## 2015-09-07 ENCOUNTER — Emergency Department
Admission: EM | Admit: 2015-09-07 | Discharge: 2015-09-07 | Disposition: A | Discharge status: Home / Self Care | Payer: Self-pay | Attending: Emergency Medicine | Admitting: Emergency Medicine

## 2015-09-07 ENCOUNTER — Encounter: Payer: Self-pay | Admitting: Emergency Medicine

## 2015-09-07 DIAGNOSIS — A0811 Acute gastroenteropathy due to Norwalk agent: Secondary | ICD-10-CM | POA: Insufficient documentation

## 2015-09-07 DIAGNOSIS — Z3202 Encounter for pregnancy test, result negative: Secondary | ICD-10-CM | POA: Insufficient documentation

## 2015-09-07 DIAGNOSIS — Z91040 Latex allergy status: Secondary | ICD-10-CM | POA: Insufficient documentation

## 2015-09-07 DIAGNOSIS — F17200 Nicotine dependence, unspecified, uncomplicated: Secondary | ICD-10-CM | POA: Insufficient documentation

## 2015-09-07 LAB — POCT PREGNANCY, URINE: PREG TEST UR: NEGATIVE

## 2015-09-07 LAB — COMPREHENSIVE METABOLIC PANEL
ALT: 12 U/L — ABNORMAL LOW (ref 14–54)
AST: 20 U/L (ref 15–41)
Albumin: 4.2 g/dL (ref 3.5–5.0)
Alkaline Phosphatase: 59 U/L (ref 38–126)
Anion gap: 8 (ref 5–15)
BUN: 10 mg/dL (ref 6–20)
CHLORIDE: 105 mmol/L (ref 101–111)
CO2: 21 mmol/L — AB (ref 22–32)
CREATININE: 0.58 mg/dL (ref 0.44–1.00)
Calcium: 8.5 mg/dL — ABNORMAL LOW (ref 8.9–10.3)
GLUCOSE: 84 mg/dL (ref 65–99)
POTASSIUM: 3.8 mmol/L (ref 3.5–5.1)
SODIUM: 134 mmol/L — AB (ref 135–145)
Total Bilirubin: 0.8 mg/dL (ref 0.3–1.2)
Total Protein: 8 g/dL (ref 6.5–8.1)

## 2015-09-07 LAB — CBC WITH DIFFERENTIAL/PLATELET
BASOS ABS: 0 10*3/uL (ref 0–0.1)
Basophils Relative: 1 %
EOS ABS: 0.4 10*3/uL (ref 0–0.7)
EOS PCT: 6 %
HCT: 40.4 % (ref 35.0–47.0)
Hemoglobin: 13 g/dL (ref 12.0–16.0)
LYMPHS PCT: 17 %
Lymphs Abs: 1.3 10*3/uL (ref 1.0–3.6)
MCH: 26.5 pg (ref 26.0–34.0)
MCHC: 32.2 g/dL (ref 32.0–36.0)
MCV: 82.1 fL (ref 80.0–100.0)
MONO ABS: 0.6 10*3/uL (ref 0.2–0.9)
Monocytes Relative: 8 %
Neutro Abs: 5.3 10*3/uL (ref 1.4–6.5)
Neutrophils Relative %: 68 %
PLATELETS: 185 10*3/uL (ref 150–440)
RBC: 4.92 MIL/uL (ref 3.80–5.20)
RDW: 14.3 % (ref 11.5–14.5)
WBC: 7.6 10*3/uL (ref 3.6–11.0)

## 2015-09-07 LAB — URINALYSIS COMPLETE WITH MICROSCOPIC (ARMC ONLY)
BACTERIA UA: NONE SEEN
Bilirubin Urine: NEGATIVE
Glucose, UA: NEGATIVE mg/dL
Hgb urine dipstick: NEGATIVE
KETONES UR: NEGATIVE mg/dL
NITRITE: NEGATIVE
PH: 7 (ref 5.0–8.0)
PROTEIN: NEGATIVE mg/dL
SPECIFIC GRAVITY, URINE: 1.011 (ref 1.005–1.030)

## 2015-09-07 MED ORDER — ONDANSETRON HCL 4 MG/2ML IJ SOLN
4.0000 mg | Freq: Once | INTRAMUSCULAR | Status: AC
  Administered 2015-09-07: 4 mg via INTRAVENOUS
  Filled 2015-09-07: qty 2

## 2015-09-07 MED ORDER — DICYCLOMINE HCL 20 MG PO TABS: 20.0000 mg | ORAL_TABLET | Freq: Three times a day (TID) | ORAL | Status: DC | PRN

## 2015-09-07 MED ORDER — SODIUM CHLORIDE 0.9 % IV SOLN
1000.0000 mL | Freq: Once | INTRAVENOUS | Status: AC
  Administered 2015-09-07: 1000 mL via INTRAVENOUS

## 2015-09-07 MED ORDER — ONDANSETRON HCL 4 MG PO TABS: 4.0000 mg | ORAL_TABLET | Freq: Every day | ORAL | Status: DC | PRN

## 2015-09-07 NOTE — ED Notes (Signed)
Pt to ed with c/o diarrhea x 3 in the last 2 hours.  Pt reports feeling nauseated and weak.

## 2015-09-07 NOTE — Discharge Instructions (Signed)

## 2015-09-07 NOTE — ED Provider Notes (Signed)
[facility] Emergency Department Provider Note     Time seen: ----------------------------------------- 8:12 AM on 09/07/2015 -----------------------------------------    I have reviewed the triage vital signs and the nursing notes.   HISTORY  Chief Complaint Diarrhea    HPI Brandy Luna is a 30 y.o. female who presents to ER for diarrhea, nausea and weakness.Patient states she's had several episodes of diarrhea over the last 2 hours, the stool has not been watery. She also feels nauseous, doesn't think she could be pregnant. She uses condoms for protection. She denies any vaginal complaints.   Past Medical History  Diagnosis Date  . Pneumonia   . Anemia     There are no active problems to display for this patient.   Past Surgical History  Procedure Laterality Date  . Tonsillectomy    . Cesarean section      Allergies Latex and Zithromax  Social History Social History  Substance Use Topics  . Smoking status: Current Some Day Smoker  . Smokeless tobacco: None  . Alcohol Use: No    Review of Systems Constitutional: Negative for fever. Eyes: Negative for visual changes. ENT: Negative for sore throat. Cardiovascular: Negative for chest pain. Respiratory: Negative for shortness of breath. Gastrointestinal: Positive for abdominal pain, nausea, diarrhea Genitourinary: Negative for dysuria. Musculoskeletal: Negative for back pain. Skin: Negative for rash. Neurological: Negative for headaches, positive for weakness  10-point ROS otherwise negative.  ____________________________________________   PHYSICAL EXAM:  VITAL SIGNS: ED Triage Vitals  Enc Vitals Group     BP 09/07/15 0805 118/73 mmHg     Pulse Rate 09/07/15 0805 79     Resp 09/07/15 0805 18     Temp 09/07/15 0805 97.6 F (36.4 C)     Temp Source 09/07/15 0805 Oral     SpO2 09/07/15 0805 100 %     Weight 09/07/15 0805 150 lb (68.04 kg)     Height 09/07/15 0805 5\' 4"   (1.626 m)     Head Cir --      Peak Flow --      Pain Score 09/07/15 0809 9     Pain Loc --      Pain Edu? --      Excl. in GC? --     Constitutional: Alert and oriented. Well appearing and in no distress. Eyes: Conjunctivae are normal. PERRL. Normal extraocular movements. ENT   Head: Normocephalic and atraumatic.   Nose: No congestion/rhinnorhea.   Mouth/Throat: Mucous membranes are moist.   Neck: No stridor. Cardiovascular: Normal rate, regular rhythm. Normal and symmetric distal pulses are present in all extremities. No murmurs, rubs, or gallops. Respiratory: Normal respiratory effort without tachypnea nor retractions. Breath sounds are clear and equal bilaterally. No wheezes/rales/rhonchi. Gastrointestinal: Soft and nontender. No distention. No abdominal bruits.  Musculoskeletal: Nontender with normal range of motion in all extremities. No joint effusions.  No lower extremity tenderness nor edema. Neurologic:  Normal speech and language. No gross focal neurologic deficits are appreciated. Speech is normal. No gait instability. Skin:  Skin is warm, dry and intact. No rash noted. Psychiatric: Mood and affect are normal. Speech and behavior are normal. Patient exhibits appropriate insight and judgment. ____________________________________________  ED COURSE:  Pertinent labs & imaging results that were available during my care of the patient were reviewed by me and considered in my medical decision making (see chart for details). Patient is in no acute distress, likely viral etiology. She'll receive IV fluids and antiemetics. ____________________________________________  LABS (pertinent positives/negatives)  Labs Reviewed  COMPREHENSIVE METABOLIC PANEL - Abnormal; Notable for the following:    Sodium 134 (*)    CO2 21 (*)    Calcium 8.5 (*)    ALT 12 (*)    All other components within normal limits  URINALYSIS COMPLETEWITH MICROSCOPIC ([facility] ONLY) - Abnormal;  Notable for the following:    Color, Urine YELLOW (*)    APPearance CLEAR (*)    Leukocytes, UA 1+ (*)    Squamous Epithelial / LPF 6-30 (*)    All other components within normal limits  CBC WITH DIFFERENTIAL/PLATELET  POC URINE PREG, ED  POCT PREGNANCY, URINE  ____________________________________________  FINAL ASSESSMENT AND PLAN  Gastroenteritis  Plan: Patient with labs as dictated above. Patient is in no acute distress, will be discharged with Zofran and Bentyl to take as needed. She is stable for outpatient follow-up with her doctor   Emily Filbert, MD   Emily Filbert, MD 09/07/15 [phone]

## 2015-09-07 NOTE — ED Notes (Signed)
MD at bedside. 

## 2015-09-07 NOTE — ED Notes (Signed)
Pt alert and oriented X4, active, cooperative, pt in NAD. RR even and unlabored, color WNL.  Pt informed to return if any life threatening symptoms occur.   

## 2015-09-07 NOTE — ED Notes (Signed)
IV fluids still infusing. Pt will be discharged when completed.

## 2016-03-20 ENCOUNTER — Encounter: Payer: Self-pay | Admitting: *Deleted

## 2016-03-20 ENCOUNTER — Emergency Department: Payer: Medicaid Other

## 2016-03-20 ENCOUNTER — Emergency Department
Admission: EM | Admit: 2016-03-20 | Discharge: 2016-03-20 | Disposition: A | Discharge status: Home / Self Care | Payer: Medicaid Other | Attending: Emergency Medicine | Admitting: Emergency Medicine

## 2016-03-20 DIAGNOSIS — O99611 Diseases of the digestive system complicating pregnancy, first trimester: Secondary | ICD-10-CM | POA: Insufficient documentation

## 2016-03-20 DIAGNOSIS — F17210 Nicotine dependence, cigarettes, uncomplicated: Secondary | ICD-10-CM | POA: Insufficient documentation

## 2016-03-20 DIAGNOSIS — Z79899 Other long term (current) drug therapy: Secondary | ICD-10-CM | POA: Diagnosis not present

## 2016-03-20 DIAGNOSIS — K5900 Constipation, unspecified: Secondary | ICD-10-CM | POA: Diagnosis present

## 2016-03-20 DIAGNOSIS — Z3A01 Less than 8 weeks gestation of pregnancy: Secondary | ICD-10-CM | POA: Insufficient documentation

## 2016-03-20 DIAGNOSIS — K5901 Slow transit constipation: Secondary | ICD-10-CM

## 2016-03-20 DIAGNOSIS — O99331 Smoking (tobacco) complicating pregnancy, first trimester: Secondary | ICD-10-CM | POA: Diagnosis not present

## 2016-03-20 DIAGNOSIS — Z3491 Encounter for supervision of normal pregnancy, unspecified, first trimester: Secondary | ICD-10-CM

## 2016-03-20 LAB — CBC WITH DIFFERENTIAL/PLATELET
BASOS PCT: 1 %
Basophils Absolute: 0.1 10*3/uL (ref 0–0.1)
Eosinophils Absolute: 0.3 10*3/uL (ref 0–0.7)
Eosinophils Relative: 5 %
HEMATOCRIT: 36.7 % (ref 35.0–47.0)
HEMOGLOBIN: 12.5 g/dL (ref 12.0–16.0)
LYMPHS ABS: 2 10*3/uL (ref 1.0–3.6)
LYMPHS PCT: 33 %
MCH: 28.9 pg (ref 26.0–34.0)
MCHC: 34.1 g/dL (ref 32.0–36.0)
MCV: 84.5 fL (ref 80.0–100.0)
MONO ABS: 0.6 10*3/uL (ref 0.2–0.9)
MONOS PCT: 10 %
NEUTROS ABS: 3.1 10*3/uL (ref 1.4–6.5)
NEUTROS PCT: 51 %
Platelets: 171 10*3/uL (ref 150–440)
RBC: 4.35 MIL/uL (ref 3.80–5.20)
RDW: 13.2 % (ref 11.5–14.5)
WBC: 6 10*3/uL (ref 3.6–11.0)

## 2016-03-20 LAB — URINALYSIS COMPLETE WITH MICROSCOPIC (ARMC ONLY)
BACTERIA UA: NONE SEEN
Bilirubin Urine: NEGATIVE
Glucose, UA: NEGATIVE mg/dL
HGB URINE DIPSTICK: NEGATIVE
Ketones, ur: NEGATIVE mg/dL
NITRITE: NEGATIVE
PH: 5 (ref 5.0–8.0)
PROTEIN: NEGATIVE mg/dL
Specific Gravity, Urine: 1.015 (ref 1.005–1.030)

## 2016-03-20 LAB — COMPREHENSIVE METABOLIC PANEL
ALBUMIN: 3.9 g/dL (ref 3.5–5.0)
ALK PHOS: 43 U/L (ref 38–126)
ALT: 9 U/L — ABNORMAL LOW (ref 14–54)
ANION GAP: 7 (ref 5–15)
AST: 19 U/L (ref 15–41)
BILIRUBIN TOTAL: 0.7 mg/dL (ref 0.3–1.2)
BUN: 6 mg/dL (ref 6–20)
CALCIUM: 8.7 mg/dL — AB (ref 8.9–10.3)
CO2: 23 mmol/L (ref 22–32)
Chloride: 106 mmol/L (ref 101–111)
Creatinine, Ser: 0.47 mg/dL (ref 0.44–1.00)
GFR calc Af Amer: >60 mL/min (ref >60)
GLUCOSE: 99 mg/dL (ref 65–99)
POTASSIUM: 3.3 mmol/L — AB (ref 3.5–5.1)
Sodium: 136 mmol/L (ref 135–145)
Total Protein: 7.4 g/dL (ref 6.5–8.1)

## 2016-03-20 LAB — HCG, QUANTITATIVE, PREGNANCY: HCG, BETA CHAIN, QUANT, S: 18329 m[IU]/mL — AB (ref <5)

## 2016-03-20 LAB — LIPASE, BLOOD: Lipase: 18 U/L (ref 11–51)

## 2016-03-20 LAB — POCT PREGNANCY, URINE: PREG TEST UR: POSITIVE — AB

## 2016-03-20 MED ORDER — MAGNESIUM CITRATE PO SOLN: 1.0000 | Freq: Once | ORAL | 1 refills | Status: AC

## 2016-03-20 MED ORDER — ONDANSETRON HCL 4 MG PO TABS: 4.0000 mg | ORAL_TABLET | Freq: Every day | ORAL | 1 refills | Status: DC | PRN

## 2016-03-20 MED ORDER — PRENATAL VITAMINS 0.8 MG PO TABS: 1.0000 | ORAL_TABLET | Freq: Every day | ORAL | 7 refills | Status: DC

## 2016-03-20 MED ORDER — ONDANSETRON 4 MG PO TBDP
4.0000 mg | ORAL_TABLET | Freq: Once | ORAL | Status: AC
  Administered 2016-03-20: 4 mg via ORAL

## 2016-03-20 MED ORDER — ONDANSETRON 4 MG PO TBDP
ORAL_TABLET | ORAL | Status: AC
  Administered 2016-03-20: 4 mg via ORAL
  Filled 2016-03-20: qty 1

## 2016-03-20 MED ORDER — MAGNESIUM CITRATE PO SOLN: 1.0000 | Freq: Once | ORAL | Status: DC

## 2016-03-20 NOTE — ED Triage Notes (Signed)
Pt reports constipation for 5 days with nausea, pt complains of abdominal pain

## 2016-03-20 NOTE — ED Provider Notes (Signed)
[facility] Emergency Department Provider Note        Time seen: ----------------------------------------- 8:55 AM on 03/20/2016 -----------------------------------------    I have reviewed the triage vital signs and the nursing notes.   HISTORY  Chief Complaint Constipation    HPI Brandy Luna is a 30 y.o. female who presents to ER with constipation for the last 5 days with associated nausea.Patient states she's not sure why she is having hard time using the bathroom. Patient thought it was related to grieving process from her grandfather dying. She reports decreased appetite, is not sure if she is pregnant. Patient does not use protection to prevent pregnancy. Abdominal pain is mild at this time.   Past Medical History:  Diagnosis Date  . Anemia   . Pneumonia     There are no active problems to display for this patient.   Past Surgical History:  Procedure Laterality Date  . CESAREAN SECTION    . TONSILLECTOMY      Allergies Latex and Zithromax [azithromycin]  Social History Social History  Substance Use Topics  . Smoking status: Current Some Day Smoker    Packs/day: 0.50    Types: Cigarettes  . Smokeless tobacco: Never Used  . Alcohol use No    Review of Systems Constitutional: Negative for fever. Cardiovascular: Negative for chest pain. Respiratory: Negative for shortness of breath. Gastrointestinal: Positive for abdominal pain, constipation Genitourinary: Negative for dysuria. Musculoskeletal: Negative for back pain. Skin: Negative for rash. Neurological: Negative for headaches, focal weakness or numbness.  10-point ROS otherwise negative.  ____________________________________________   PHYSICAL EXAM:  VITAL SIGNS: ED Triage Vitals  Enc Vitals Group     BP --      Pulse --      Resp --      Temp --      Temp src --      SpO2 --      Weight 03/20/16 0845 148 lb (67.1 kg)     Height 03/20/16 0845 5\' 4"  (1.626 m)      Head Circumference --      Peak Flow --      Pain Score 03/20/16 0846 3     Pain Loc --      Pain Edu? --      Excl. in GC? --     Constitutional: Alert and oriented. Well appearing and in no distress. Eyes: Conjunctivae are normal. PERRL. Normal extraocular movements. ENT   Head: Normocephalic and atraumatic.   Nose: No congestion/rhinnorhea.   Mouth/Throat: Mucous membranes are moist.   Neck: No stridor. Cardiovascular: Normal rate, regular rhythm. No murmurs, rubs, or gallops. Respiratory: Normal respiratory effort without tachypnea nor retractions. Breath sounds are clear and equal bilaterally. No wheezes/rales/rhonchi. Gastrointestinal: Soft and nontender. Normal bowel sounds Musculoskeletal: Nontender with normal range of motion in all extremities. No lower extremity tenderness nor edema. Neurologic:  Normal speech and language. No gross focal neurologic deficits are appreciated.  Skin:  Skin is warm, dry and intact. No rash noted. Psychiatric: Mood and affect are normal. Speech and behavior are normal.  ____________________________________________  ED COURSE:  Pertinent labs & imaging results that were available during my care of the patient were reviewed by me and considered in my medical decision making (see chart for details). Clinical Course  Patient is no distress, we will check basic labs and imaging.  Procedures ____________________________________________   LABS (pertinent positives/negatives)  Labs Reviewed  COMPREHENSIVE METABOLIC PANEL - Abnormal; Notable for the  following:       Result Value   Potassium 3.3 (*)    Calcium 8.7 (*)    ALT 9 (*)    All other components within normal limits  URINALYSIS COMPLETEWITH MICROSCOPIC ([facility] ONLY) - Abnormal; Notable for the following:    Color, Urine YELLOW (*)    APPearance HAZY (*)    Leukocytes, UA 2+ (*)    Squamous Epithelial / LPF 6-30 (*)    All other components within normal limits   HCG, QUANTITATIVE, PREGNANCY - Abnormal; Notable for the following:    hCG, Beta Chain, Quant, S 18,329 (*)    All other components within normal limits  POCT PREGNANCY, URINE - Abnormal; Notable for the following:    Preg Test, Ur POSITIVE (*)    All other components within normal limits  CBC WITH DIFFERENTIAL/PLATELET  LIPASE, BLOOD  POC URINE PREG, ED    RADIOLOGY Pregnancy ultrasound IMPRESSION: Early intrauterine gestational sac. Filled sac is visualized, but no fetal pole currently. Estimated gestational age by mean sac diameter 5 weeks 5 days.   ____________________________________________  FINAL ASSESSMENT AND PLAN  First trimester pregnancy, constipation  Plan: Patient with labs and imaging as dictated above. Patient is in no distress, she'll be discharged with laxatives and they're safe to take in pregnancy. She'll be referred to OB/GYN for follow-up.   Emily FilbertWilliams, Johnwilliam Shepperson E, MD   Note: This dictation was prepared with Dragon dictation. Any transcriptional errors that result from this process are unintentional    Emily FilbertJonathan E Marycarmen Hagey, MD 03/20/16 [phone]

## 2016-03-20 NOTE — ED Notes (Signed)
Pt to ed with c/o constipation x 5 days.  Pt also reports lower abd pain and tenderness with palpation.  +bs x 4.  Pt skin warm and dry.  Also reports nausea, denies vomiting.

## 2016-04-05 ENCOUNTER — Other Ambulatory Visit: Payer: Self-pay | Admitting: Physician Assistant

## 2016-04-05 DIAGNOSIS — Z369 Encounter for antenatal screening, unspecified: Secondary | ICD-10-CM

## 2016-04-05 LAB — OB RESULTS CONSOLE HIV ANTIBODY (ROUTINE TESTING): HIV: NONREACTIVE

## 2016-04-05 LAB — HM HIV SCREENING LAB: HM HIV Screening: NEGATIVE

## 2016-04-05 LAB — HM PAP SMEAR: HM Pap smear: NEGATIVE

## 2016-04-06 LAB — OB RESULTS CONSOLE GC/CHLAMYDIA
Chlamydia: NEGATIVE
Gonorrhea: NEGATIVE

## 2016-04-06 LAB — OB RESULTS CONSOLE HEPATITIS B SURFACE ANTIGEN: HEP B S AG: NEGATIVE

## 2016-04-24 DIAGNOSIS — F4323 Adjustment disorder with mixed anxiety and depressed mood: Secondary | ICD-10-CM | POA: Insufficient documentation

## 2016-04-26 ENCOUNTER — Emergency Department
Admission: EM | Admit: 2016-04-26 | Discharge: 2016-04-26 | Disposition: A | Discharge status: Home / Self Care | Payer: Medicaid Other | Attending: Emergency Medicine | Admitting: Emergency Medicine

## 2016-04-26 ENCOUNTER — Encounter: Payer: Self-pay | Admitting: *Deleted

## 2016-04-26 DIAGNOSIS — Z79899 Other long term (current) drug therapy: Secondary | ICD-10-CM | POA: Diagnosis not present

## 2016-04-26 DIAGNOSIS — Z3A12 12 weeks gestation of pregnancy: Secondary | ICD-10-CM | POA: Diagnosis not present

## 2016-04-26 DIAGNOSIS — F17210 Nicotine dependence, cigarettes, uncomplicated: Secondary | ICD-10-CM | POA: Diagnosis not present

## 2016-04-26 DIAGNOSIS — O210 Mild hyperemesis gravidarum: Secondary | ICD-10-CM

## 2016-04-26 LAB — COMPREHENSIVE METABOLIC PANEL
ALBUMIN: 4.4 g/dL (ref 3.5–5.0)
ALK PHOS: 44 U/L (ref 38–126)
ALT: 14 U/L (ref 14–54)
AST: 21 U/L (ref 15–41)
Anion gap: 7 (ref 5–15)
BILIRUBIN TOTAL: 1 mg/dL (ref 0.3–1.2)
BUN: 5 mg/dL — AB (ref 6–20)
CALCIUM: 9.3 mg/dL (ref 8.9–10.3)
CO2: 25 mmol/L (ref 22–32)
CREATININE: 0.43 mg/dL — AB (ref 0.44–1.00)
Chloride: 101 mmol/L (ref 101–111)
GFR calc Af Amer: >60 mL/min (ref >60)
GLUCOSE: 84 mg/dL (ref 65–99)
POTASSIUM: 3.4 mmol/L — AB (ref 3.5–5.1)
Sodium: 133 mmol/L — ABNORMAL LOW (ref 135–145)
TOTAL PROTEIN: 8 g/dL (ref 6.5–8.1)

## 2016-04-26 LAB — CBC
HEMATOCRIT: 38 % (ref 35.0–47.0)
Hemoglobin: 13 g/dL (ref 12.0–16.0)
MCH: 28.5 pg (ref 26.0–34.0)
MCHC: 34.2 g/dL (ref 32.0–36.0)
MCV: 83.4 fL (ref 80.0–100.0)
PLATELETS: 194 10*3/uL (ref 150–440)
RBC: 4.56 MIL/uL (ref 3.80–5.20)
RDW: 13.8 % (ref 11.5–14.5)
WBC: 9.5 10*3/uL (ref 3.6–11.0)

## 2016-04-26 LAB — LIPASE, BLOOD: Lipase: 19 U/L (ref 11–51)

## 2016-04-26 MED ORDER — PROMETHAZINE HCL 25 MG/ML IJ SOLN
25.0000 mg | Freq: Once | INTRAMUSCULAR | Status: AC
  Administered 2016-04-26: 25 mg via INTRAVENOUS
  Filled 2016-04-26: qty 1

## 2016-04-26 MED ORDER — SODIUM CHLORIDE 0.9 % IV BOLUS (SEPSIS)
1000.0000 mL | Freq: Once | INTRAVENOUS | Status: AC
  Administered 2016-04-26: 1000 mL via INTRAVENOUS

## 2016-04-26 MED ORDER — ONDANSETRON HCL 4 MG/2ML IJ SOLN
INTRAMUSCULAR | Status: DC
Start: 2016-04-26 — End: 2016-04-26
  Filled 2016-04-26: qty 2

## 2016-04-26 MED ORDER — PROMETHAZINE HCL 25 MG PO TABS: 25.0000 mg | ORAL_TABLET | Freq: Four times a day (QID) | ORAL | 0 refills | Status: DC | PRN

## 2016-04-26 MED ORDER — ONDANSETRON 4 MG PO TBDP
4.0000 mg | ORAL_TABLET | Freq: Three times a day (TID) | ORAL | 0 refills | Status: DC | PRN
Start: 2016-04-26 — End: 2016-08-07

## 2016-04-26 MED ORDER — PROMETHAZINE HCL 25 MG RE SUPP: 25.0000 mg | Freq: Four times a day (QID) | RECTAL | 1 refills | Status: DC | PRN

## 2016-04-26 MED ORDER — ONDANSETRON HCL 4 MG/2ML IJ SOLN
4.0000 mg | Freq: Once | INTRAMUSCULAR | Status: AC
  Administered 2016-04-26: 4 mg via INTRAVENOUS

## 2016-04-26 MED ORDER — ONDANSETRON 4 MG PO TBDP
4.0000 mg | ORAL_TABLET | Freq: Once | ORAL | Status: AC
  Administered 2016-04-26: 4 mg via ORAL

## 2016-04-26 NOTE — ED Notes (Signed)
Pt actively vomiting at this time. MD aware.

## 2016-04-26 NOTE — ED Notes (Signed)
Pt unable to urinate at this moment, given specimen cup for when is able to void.  

## 2016-04-26 NOTE — ED Provider Notes (Signed)
[facility] Emergency Department Provider Note  Time seen: 4:25 PM  I have reviewed the triage vital signs and the nursing notes.   HISTORY  Chief Complaint Emesis    HPI Brandy Luna is a 30 y.o. female G3 P2, approximately [redacted] weeks pregnant who presents the emergency department with nausea and vomiting. Patient states with her prior pregnancies she has been nauseous with morning sickness, but large the resolved by 10-12 weeks. She states over the past 2 days she has not been able to keep anything down and is vomiting. Frequently and feels like she is dehydrated. Denies any abdominal pain. Denies any vaginal bleeding. Denies diarrhea. The patient is currently seeing the health Department as her OB/GYN. Patient states she came to the emergency department due to persistent nausea and vomiting, she has tried taking Phenergan but she vomited this back up.  Past Medical History:  Diagnosis Date  . Anemia   . Pneumonia     There are no active problems to display for this patient.   Past Surgical History:  Procedure Laterality Date  . CESAREAN SECTION    . TONSILLECTOMY      Prior to Admission medications   Medication Sig Start Date End Date Taking? Authorizing Provider  dicyclomine (BENTYL) 20 MG tablet Take 1 tablet (20 mg total) by mouth 3 (three) times daily as needed for spasms. 09/07/15   Emily Filbert, MD  ibuprofen (ADVIL,MOTRIN) 600 MG tablet Take 600 mg by mouth every 6 (six) hours as needed. For pain    Historical Provider, MD  medroxyPROGESTERone (PROVERA) 5 MG tablet Take 1 tablet (5 mg total) by mouth daily. 08/02/12   Gwyneth Sprout, MD  norgestrel-ethinyl estradiol (LO/OVRAL,CRYSELLE) 0.3-30 MG-MCG tablet Take 1 tablet by mouth daily. 12/16/14 12/16/15  Emily Filbert, MD  ondansetron (ZOFRAN) 4 MG tablet Take 1 tablet (4 mg total) by mouth daily as needed for nausea or vomiting. 09/07/15 09/06/16  Emily Filbert, MD  ondansetron  (ZOFRAN) 4 MG tablet Take 1 tablet (4 mg total) by mouth daily as needed for nausea or vomiting. 03/20/16   Emily Filbert, MD  Prenatal Multivit-Min-Fe-FA (PRENATAL VITAMINS) 0.8 MG tablet Take 1 tablet by mouth daily. 03/20/16   Emily Filbert, MD  traMADol (ULTRAM) 50 MG tablet Take 1 tablet (50 mg total) by mouth every 6 (six) hours as needed. 08/14/15 08/13/16  Jene Every, MD    Allergies  Allergen Reactions  . Latex Rash  . Zithromax [Azithromycin] Rash    Pt states gets rash and fever    No family history on file.  Social History Social History  Substance Use Topics  . Smoking status: Current Some Day Smoker    Packs/day: 0.50    Types: Cigarettes  . Smokeless tobacco: Never Used  . Alcohol use No    Review of Systems Constitutional: Negative for fever. Cardiovascular: Negative for chest pain. Respiratory: Negative for shortness of breath. Gastrointestinal: Negative for abdominal pain. Positive for nausea and vomiting. Negative for diarrhea. Musculoskeletal: Negative for back pain. Neurological: Negative for headache 10-point ROS otherwise negative.  ____________________________________________   PHYSICAL EXAM:  VITAL SIGNS: ED Triage Vitals [04/26/16 1456]  Enc Vitals Group     BP 109/71     Pulse Rate 94     Resp 20     Temp 98.3 F (36.8 C)     Temp Source Oral     SpO2 100 %     Weight  Height      Head Circumference      Peak Flow      Pain Score      Pain Loc      Pain Edu?      Excl. in GC?     Constitutional: Alert and oriented. Well appearing and in no distress. Eyes: Normal exam ENT   Head: Normocephalic and atraumatic.   Mouth/Throat: Dry mucous membranes. Cardiovascular: Normal rate, regular rhythm. No murmur Respiratory: Normal respiratory effort without tachypnea nor retractions. Breath sounds are clear  Gastrointestinal: Soft and nontender. No distention.   Musculoskeletal: Nontender with normal range of motion  in all extremities.  Neurologic:  Normal speech and language. No gross focal neurologic deficits  Skin:  Skin is warm, dry and intact.  Psychiatric: Mood and affect are normal.  ____________________________________________    EKG  EKG reviewed and interpreted, such as normal sinus rhythm at 84 bpm, narrow QRS, normal axis, normal intervals. No ST changes. Normal EKG.  ____________________________________________   INITIAL IMPRESSION / ASSESSMENT AND PLAN / ED COURSE  Pertinent labs & imaging results that were available during my care of the patient were reviewed by me and considered in my medical decision making (see chart for details).  The patient presents emergency department approximately [redacted] weeks pregnant with nausea and vomiting. Denies any vaginal bleeding. Denies abdominal pain. Patient's labs are largely within normal limits. We will IV hydrate and treat nausea with IV Phenergan. Patient is agreeable to this plan.  Patient feeling much better after fluids. States she no longer feels nauseated. We'll discharge with Zofran, Phenergan and Phenergan suppository. Patient will follow up with her OB/GYN on Monday.  ____________________________________________   FINAL CLINICAL IMPRESSION(S) / ED DIAGNOSES  Hyperemesis gravidarum    Minna AntisKevin Glendel Jaggers, MD 04/26/16 2009

## 2016-04-26 NOTE — ED Triage Notes (Signed)
Pt is [redacted] weeks pregnant, pt has been vomiting since yesterday, pt unable to keep any PO fluids down

## 2016-04-26 NOTE — ED Notes (Signed)

## 2016-05-03 ENCOUNTER — Encounter: Payer: Self-pay | Admitting: *Deleted

## 2016-05-03 ENCOUNTER — Ambulatory Visit
Admission: RE | Admit: 2016-05-03 | Discharge: 2016-05-03 | Disposition: A | Discharge status: Home / Self Care | Payer: Medicaid Other | Source: Ambulatory Visit | Attending: Maternal & Fetal Medicine | Admitting: Maternal & Fetal Medicine

## 2016-05-03 ENCOUNTER — Ambulatory Visit (HOSPITAL_BASED_OUTPATIENT_CLINIC_OR_DEPARTMENT_OTHER)
Admission: RE | Admit: 2016-05-03 | Discharge: 2016-05-03 | Disposition: A | Discharge status: Home / Self Care | Payer: Medicaid Other | Source: Ambulatory Visit | Attending: Maternal & Fetal Medicine | Admitting: Maternal & Fetal Medicine

## 2016-05-03 VITALS — BP 107/60 | HR 94 | Temp 98.2°F | Wt 144.0 lb

## 2016-05-03 DIAGNOSIS — Z3A12 12 weeks gestation of pregnancy: Secondary | ICD-10-CM | POA: Diagnosis not present

## 2016-05-03 DIAGNOSIS — Z369 Encounter for antenatal screening, unspecified: Secondary | ICD-10-CM

## 2016-05-04 DIAGNOSIS — R87619 Unspecified abnormal cytological findings in specimens from cervix uteri: Secondary | ICD-10-CM | POA: Insufficient documentation

## 2016-05-07 ENCOUNTER — Telehealth: Payer: Self-pay | Admitting: Obstetrics and Gynecology

## 2016-05-07 LAB — HEMOGLOBINOPATHY EVALUATION
Hgb A2 Quant: 2.3 % (ref 0.7–3.1)
Hgb A: 97.7 % (ref 94.0–98.0)
Hgb C: 0 %
Hgb F Quant: 0 % (ref 0.0–2.0)
Hgb S Quant: 0 %

## 2016-05-07 NOTE — Telephone Encounter (Signed)
  Ms. Brandy Luna elected to undergo First Trimester screening on 05/03/2016.  To review, first trimester screening, includes nuchal translucency ultrasound screen and/or first trimester maternal serum marker screening.  The nuchal translucency has approximately an 80% detection rate for Down syndrome and can be positive for other chromosome abnormalities as well as heart defects.  When combined with a maternal serum marker screening, the detection rate is up to 90% for Down syndrome and up to 97% for trisomy 13 and 18.     The results of the First Trimester Nuchal Translucency and Biochemical Screening were within normal range.  The risk for Down syndrome is now estimated to be less than 1 in 10,000.  The risk for Trisomy 13/18 is also estimated to be less than 1 in 10,000.  Should more definitive information be desired, we would offer amniocentesis.  Because we do not yet know the effectiveness of combined first and second trimester screening, we do not recommend a maternal serum screen to assess the chance for chromosome conditions.  However, if screening for neural tube defects is desired, maternal serum screening for AFP only can be performed between 15 and [redacted] weeks gestation.    In addition, due to the family history of sickle cell disease, the patient had hemoglobinopathy testing.  The fractionation results showed normal adult hemoglobin (AA) and CBC showed a normal MCV.  Brandy Andersoneborah F. Shermika Balthaser, MS, CGC

## 2016-06-11 ENCOUNTER — Other Ambulatory Visit: Payer: Self-pay | Admitting: *Deleted

## 2016-06-11 DIAGNOSIS — Z3482 Encounter for supervision of other normal pregnancy, second trimester: Secondary | ICD-10-CM

## 2016-06-14 ENCOUNTER — Ambulatory Visit
Admission: RE | Admit: 2016-06-14 | Discharge: 2016-06-14 | Disposition: A | Discharge status: Home / Self Care | Payer: Medicaid Other | Source: Ambulatory Visit | Attending: Obstetrics and Gynecology | Admitting: Obstetrics and Gynecology

## 2016-06-14 DIAGNOSIS — Z3482 Encounter for supervision of other normal pregnancy, second trimester: Secondary | ICD-10-CM

## 2016-08-07 ENCOUNTER — Observation Stay
Admission: EM | Admit: 2016-08-07 | Discharge: 2016-08-07 | Disposition: A | Discharge status: Home / Self Care | Payer: Medicaid Other | Attending: Certified Nurse Midwife | Admitting: Certified Nurse Midwife

## 2016-08-07 DIAGNOSIS — O26892 Other specified pregnancy related conditions, second trimester: Secondary | ICD-10-CM | POA: Diagnosis not present

## 2016-08-07 DIAGNOSIS — Z87891 Personal history of nicotine dependence: Secondary | ICD-10-CM | POA: Diagnosis not present

## 2016-08-07 DIAGNOSIS — M79604 Pain in right leg: Secondary | ICD-10-CM

## 2016-08-07 DIAGNOSIS — M545 Low back pain: Secondary | ICD-10-CM | POA: Insufficient documentation

## 2016-08-07 DIAGNOSIS — Z3A25 25 weeks gestation of pregnancy: Secondary | ICD-10-CM | POA: Insufficient documentation

## 2016-08-07 DIAGNOSIS — F4323 Adjustment disorder with mixed anxiety and depressed mood: Secondary | ICD-10-CM | POA: Diagnosis not present

## 2016-08-07 LAB — URINALYSIS, COMPLETE (UACMP) WITH MICROSCOPIC
Bilirubin Urine: NEGATIVE
GLUCOSE, UA: NEGATIVE mg/dL
Hgb urine dipstick: NEGATIVE
Ketones, ur: NEGATIVE mg/dL
NITRITE: NEGATIVE
PROTEIN: NEGATIVE mg/dL
Specific Gravity, Urine: 1.015 (ref 1.005–1.030)
pH: 7 (ref 5.0–8.0)

## 2016-08-07 MED ORDER — CYCLOBENZAPRINE HCL 10 MG PO TABS
10.0000 mg | ORAL_TABLET | Freq: Once | ORAL | Status: AC
  Administered 2016-08-07: 10 mg via ORAL
  Filled 2016-08-07: qty 1

## 2016-08-07 MED ORDER — CYCLOBENZAPRINE HCL 10 MG PO TABS: ORAL_TABLET | ORAL | 0 refills | Status: AC

## 2016-08-07 NOTE — Discharge Instructions (Signed)
You have been prescribed flexeril. This medication can make you sleepy. Do not drive a car while taking this medication.     Back Pain in Pregnancy Introduction Back pain during pregnancy is common. Back pain may be caused by several factors that are related to changes during your pregnancy. Follow these instructions at home: Managing pain, stiffness, and swelling  If directed, apply ice for sudden (acute) back pain.  Put ice in a plastic bag.  Place a towel between your skin and the bag.  Leave the ice on for 20 minutes, 2-3 times per day.  If directed, apply heat to the affected area before you exercise:  Place a towel between your skin and the heat pack or heating pad.  Leave the heat on for 20-30 minutes.  Remove the heat if your skin turns bright red. This is especially important if you are unable to feel pain, heat, or cold. You may have a greater risk of getting burned. Activity  Exercise as told by your health care provider. Exercising is the best way to prevent or manage back pain.  Listen to your body when lifting. If lifting hurts, ask for help or bend your knees. This uses your leg muscles instead of your back muscles.  Squat down when picking up something from the floor. Do not bend over.  Only use bed rest as told by your health care provider. Bed rest should only be used for the most severe episodes of back pain. Standing, Sitting, and Lying Down  Do not stand in one place for long periods of time.  Use good posture when sitting. Make sure your head rests over your shoulders and is not hanging forward. Use a pillow on your lower back if necessary.  Try sleeping on your side, preferably the left side, with a pillow or two between your legs. If you are sore after a night's rest, your bed may be too soft. A firm mattress may provide more support for your back during pregnancy. General instructions  Do not wear high heels.  Eat a healthy diet. Try to gain  weight within your health care provider's recommendations.  Use a maternity girdle, elastic sling, or back brace as told by your health care provider.  Take over-the-counter and prescription medicines only as told by your health care provider.  Keep all follow-up visits as told by your health care provider. This is important. This includes any visits with any specialists, such as a physical therapist. Contact a health care provider if:  Your back pain interferes with your daily activities.  You have increasing pain in other parts of your body. Get help right away if:  You develop numbness, tingling, weakness, or problems with the use of your arms or legs.  You develop severe back pain that is not controlled with medicine.  You have a sudden change in bowel or bladder control.  You develop shortness of breath, dizziness, or you faint.  You develop nausea, vomiting, or sweating.  You have back pain that is a rhythmic, cramping pain similar to labor pains. Labor pain is usually 1-2 minutes apart, lasts for about 1 minute, and involves a bearing down feeling or pressure in your pelvis.  You have back pain and your water breaks or you have vaginal bleeding.  You have back pain or numbness that travels down your leg.  Your back pain developed after you fell.  You develop pain on one side of your back.  You see blood in  your urine.  You develop skin blisters in the area of your back pain. This information is not intended to replace advice given to you by your health care provider. Make sure you discuss any questions you have with your health care provider. Document Released: 10/24/2005 Document Revised: 12/22/2015 Document Reviewed: 03/30/2015  2017 Elsevier

## 2016-08-07 NOTE — OB Triage Note (Signed)
Ms. Brandy Luna here with c/o lower back pain that started at 1030 this morning, reports as sudden onset, sharp and intermittent. Denies bleeding, LOF, reports positive fetal movement. G3P2, c/sx2.
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
## 2016-09-25 ENCOUNTER — Encounter: Payer: Medicaid Other | Admitting: Obstetrics and Gynecology

## 2016-10-02 ENCOUNTER — Ambulatory Visit (INDEPENDENT_AMBULATORY_CARE_PROVIDER_SITE_OTHER): Payer: Medicaid Other | Admitting: Obstetrics & Gynecology

## 2016-10-02 VITALS — BP 98/60 | Wt 177.0 lb

## 2016-10-02 DIAGNOSIS — Z87898 Personal history of other specified conditions: Secondary | ICD-10-CM

## 2016-10-02 DIAGNOSIS — B3731 Acute candidiasis of vulva and vagina: Secondary | ICD-10-CM

## 2016-10-02 DIAGNOSIS — N898 Other specified noninflammatory disorders of vagina: Secondary | ICD-10-CM | POA: Insufficient documentation

## 2016-10-02 DIAGNOSIS — Z98891 History of uterine scar from previous surgery: Secondary | ICD-10-CM

## 2016-10-02 DIAGNOSIS — Z3A33 33 weeks gestation of pregnancy: Secondary | ICD-10-CM

## 2016-10-02 DIAGNOSIS — Z3490 Encounter for supervision of normal pregnancy, unspecified, unspecified trimester: Secondary | ICD-10-CM | POA: Insufficient documentation

## 2016-10-02 DIAGNOSIS — F1911 Other psychoactive substance abuse, in remission: Secondary | ICD-10-CM | POA: Insufficient documentation

## 2016-10-02 DIAGNOSIS — B373 Candidiasis of vulva and vagina: Secondary | ICD-10-CM | POA: Diagnosis not present

## 2016-10-02 DIAGNOSIS — O09819 Supervision of pregnancy resulting from assisted reproductive technology, unspecified trimester: Secondary | ICD-10-CM

## 2016-10-02 MED ORDER — TERCONAZOLE 0.4 % VA CREA: 1.0000 | TOPICAL_CREAM | Freq: Every day | VAGINAL | 1 refills | Status: DC

## 2016-10-02 NOTE — Progress Notes (Signed)
HPI:      Brandy Luna is a 31 y.o. G3P0 who LMP was Patient's last menstrual period was 01/19/2016., presents today for a problem visit.  She complains of:  Vaginitis: Patient complains of an abnormal vaginal discharge for 3 days. Vaginal symptoms include vulvar itching.Vulvar symptoms include vulvar itching.STI Risk: Very low risk of STD exposureDischarge described as: white.Other associated symptoms: none.Menstrual pattern: She had been bleeding none as she is pregnant. Contraception: none  PMHx: She  has a past medical history of Anemia; Anxiety; Depression; and Pneumonia. Also,  has a past surgical history that includes Tonsillectomy and Cesarean section., family history includes Diabetes in her maternal grandfather, maternal grandmother, and mother; Hypertension in her mother; Stroke in her maternal grandfather and maternal grandmother.,  reports that she has quit smoking. Her smoking use included Cigarettes. She smoked 0.50 packs per day. She has never used smokeless tobacco. She reports that she does not drink alcohol or use drugs.  She has a current medication list which includes the following prescription(s): cyclobenzaprine, ondansetron, prenatal vitamins, and promethazine. Also, is allergic to latex and zithromax [azithromycin].  Review of Systems  Constitutional: Negative for chills, fever and malaise/fatigue.  HENT: Negative for congestion, sinus pain and sore throat.   Eyes: Negative for blurred vision and pain.  Respiratory: Negative for cough and wheezing.   Cardiovascular: Negative for chest pain and leg swelling.  Gastrointestinal: Negative for abdominal pain, constipation, diarrhea, heartburn, nausea and vomiting.  Genitourinary: Negative for dysuria, frequency, hematuria and urgency.  Musculoskeletal: Negative for back pain, joint pain, myalgias and neck pain.  Skin: Negative for itching and rash.  Neurological: Negative for dizziness, tremors and weakness.   Endo/Heme/Allergies: Does not bruise/bleed easily.  Psychiatric/Behavioral: Negative for depression. The patient is not nervous/anxious and does not have insomnia.     Objective: Vitals:   10/02/16 0932  BP: 98/60   Physical Exam  Constitutional: She is oriented to person, place, and time. She appears well-developed and well-nourished. No distress.  Genitourinary: Rectum normal and vagina normal. Pelvic exam was performed with patient supine. There is no rash or lesion on the right labia. There is no rash or lesion on the left labia. Vagina exhibits no lesion. No bleeding in the vagina.  Cardiovascular: Normal rate.   Pulmonary/Chest: Effort normal.  Abdominal: Bowel sounds are normal. She exhibits no distension. There is no tenderness. There is no rebound.  FH 33, FHT 140s  Musculoskeletal: Normal range of motion.  Neurological: She is alert and oriented to person, place, and time.  Skin: Skin is warm and dry.  Psychiatric: She has a normal mood and affect.  Vitals reviewed.  WET PREP:   positive hyphae and neg clue cell or trich Findings are consistent with monilia vaginitis.  ASSESSMENT/PLAN:    Problem List Items Addressed This Visit      Genitourinary   Yeast vaginitis     Other   High risk pregnancy due to assisted reproductive technology, antepartum   History of cesarean delivery   History of substance abuse   Vaginal discharge    Other Visit Diagnoses    [redacted] weeks gestation of pregnancy    -  Primary     Treat w meds  Annamarie MajorPaul Harris, MD, Merlinda FrederickFACOG [facility], [facility] 10/02/2016  10:28 AM

## 2016-10-03 ENCOUNTER — Encounter: Payer: Medicaid Other | Admitting: Advanced Practice Midwife

## 2016-10-05 LAB — URINE DRUG PANEL 7
AMPHETAMINES, URINE: NEGATIVE ng/mL
BENZODIAZEPINE QUANT UR: NEGATIVE ng/mL
Barbiturate Quant, Ur: NEGATIVE ng/mL
CANNABINOID QUANT UR: POSITIVE — AB
Cocaine (Metab.): NEGATIVE ng/mL
Opiate Quant, Ur: NEGATIVE ng/mL
PCP QUANT UR: NEGATIVE ng/mL

## 2016-10-09 ENCOUNTER — Encounter: Payer: Self-pay | Admitting: *Deleted

## 2016-10-09 ENCOUNTER — Inpatient Hospital Stay
Admission: EM | Admit: 2016-10-09 | Discharge: 2016-10-14 | DRG: 765 | Disposition: A | Discharge status: Home / Self Care | Payer: Medicaid Other | Attending: Obstetrics and Gynecology | Admitting: Obstetrics and Gynecology

## 2016-10-09 DIAGNOSIS — Z87891 Personal history of nicotine dependence: Secondary | ICD-10-CM

## 2016-10-09 DIAGNOSIS — Z302 Encounter for sterilization: Secondary | ICD-10-CM

## 2016-10-09 DIAGNOSIS — Z98891 History of uterine scar from previous surgery: Secondary | ICD-10-CM

## 2016-10-09 DIAGNOSIS — O9989 Other specified diseases and conditions complicating pregnancy, childbirth and the puerperium: Secondary | ICD-10-CM | POA: Diagnosis not present

## 2016-10-09 DIAGNOSIS — Z91040 Latex allergy status: Secondary | ICD-10-CM

## 2016-10-09 DIAGNOSIS — O09819 Supervision of pregnancy resulting from assisted reproductive technology, unspecified trimester: Secondary | ICD-10-CM

## 2016-10-09 DIAGNOSIS — O9081 Anemia of the puerperium: Secondary | ICD-10-CM | POA: Diagnosis not present

## 2016-10-09 DIAGNOSIS — Z833 Family history of diabetes mellitus: Secondary | ICD-10-CM

## 2016-10-09 DIAGNOSIS — O42111 Preterm premature rupture of membranes, onset of labor more than 24 hours following rupture, first trimester: Secondary | ICD-10-CM | POA: Diagnosis present

## 2016-10-09 DIAGNOSIS — O34211 Maternal care for low transverse scar from previous cesarean delivery: Principal | ICD-10-CM | POA: Diagnosis present

## 2016-10-09 DIAGNOSIS — O9962 Diseases of the digestive system complicating childbirth: Secondary | ICD-10-CM | POA: Diagnosis present

## 2016-10-09 DIAGNOSIS — R1031 Right lower quadrant pain: Secondary | ICD-10-CM | POA: Diagnosis present

## 2016-10-09 DIAGNOSIS — Z6791 Unspecified blood type, Rh negative: Secondary | ICD-10-CM

## 2016-10-09 DIAGNOSIS — Z3A34 34 weeks gestation of pregnancy: Secondary | ICD-10-CM

## 2016-10-09 DIAGNOSIS — D62 Acute posthemorrhagic anemia: Secondary | ICD-10-CM | POA: Diagnosis not present

## 2016-10-09 DIAGNOSIS — K219 Gastro-esophageal reflux disease without esophagitis: Secondary | ICD-10-CM | POA: Diagnosis present

## 2016-10-09 DIAGNOSIS — F1911 Other psychoactive substance abuse, in remission: Secondary | ICD-10-CM

## 2016-10-09 DIAGNOSIS — O42913 Preterm premature rupture of membranes, unspecified as to length of time between rupture and onset of labor, third trimester: Secondary | ICD-10-CM | POA: Diagnosis present

## 2016-10-09 MED ORDER — ACETAMINOPHEN 325 MG PO TABS: 650.0000 mg | ORAL_TABLET | ORAL | Status: DC | PRN

## 2016-10-09 NOTE — Final Progress Note (Signed)
Physician Final Progress Note  Patient ID: Brandy Luna MRN: [id number] DOB/AGE: 04/04/1986 31 y.o.  Admit date: 10/09/2016 Admitting provider: Vena Austria, MD Discharge date: 10/09/2016   Admission Diagnoses: Postcoital spotting  Discharge Diagnoses:  Active Problems:   Labor and delivery indication for care or intervention  31 yo G3P2002 at [redacted]w[redacted]d presenting with vaginal bleeding, intercourse earlier today.  No active bleeding on speculum exam at presentation, some faint pink discharge, cervix closed, no evidence of pooling or ferning.  +FM, no ctx.  Rh positive.  Monitored for 1-hr with reassuring fetal surveillance no further bleeding   Consults: None  Significant Findings/ Diagnostic Studies: negative ferning slide  Procedures:  Baseline: 140  Variability: moderate Accelerations: present Decelerations: absent Tocometry: none The patient was monitored for 30 minutes, fetal heart rate tracing was deemed reactive, category I tracing,   Discharge Condition: good  Disposition: 01-Home or Self Care  Diet: Regular diet  Discharge Activity: Activity as tolerated  Discharge Instructions    Discharge activity:  No Restrictions    Complete by:  As directed    Discharge diet:  No restrictions    Complete by:  As directed    Fetal Kick Count:  Lie on our left side for one hour after a meal, and count the number of times your baby kicks.  If it is less than 5 times, get up, move around and drink some juice.  Repeat the test 30 minutes later.  If it is still less than 5 kicks in an hour, notify your doctor.    Complete by:  As directed    LABOR:  When conractions begin, you should start to time them from the beginning of one contraction to the beginning  of the next.  When contractions are 5 - 10 minutes apart or less and have been regular for at least an hour, you should call your health care provider.    Complete by:  As directed    No sexual activity restrictions     Complete by:  As directed    Notify physician for bleeding from the vagina    Complete by:  As directed    Notify physician for blurring of vision or spots before the eyes    Complete by:  As directed    Notify physician for chills or fever    Complete by:  As directed    Notify physician for fainting spells, "black outs" or loss of consciousness    Complete by:  As directed    Notify physician for increase in vaginal discharge    Complete by:  As directed    Notify physician for leaking of fluid    Complete by:  As directed    Notify physician for pain or burning when urinating    Complete by:  As directed    Notify physician for pelvic pressure (sudden increase)    Complete by:  As directed    Notify physician for severe or continued nausea or vomiting    Complete by:  As directed    Notify physician for sudden gushing of fluid from the vagina (with or without continued leaking)    Complete by:  As directed    Notify physician for sudden, constant, or occasional abdominal pain    Complete by:  As directed    Notify physician if baby moving less than usual    Complete by:  As directed      Allergies as of 10/09/2016  Reactions   Latex Rash   Zithromax [azithromycin] Rash   Pt states gets rash and fever      Medication List    STOP taking these medications   ondansetron 4 MG tablet Commonly known as:  ZOFRAN   Prenatal Vitamins 0.8 MG tablet   promethazine 25 MG tablet Commonly known as:  PHENERGAN   terconazole 0.4 % vaginal cream Commonly known as:  TERAZOL 7     TAKE these medications   cyclobenzaprine 10 MG tablet Commonly known as:  FLEXERIL Take 1/2 to one tablet q8 hours prn   prenatal multivitamin Tabs tablet Take 1 tablet by mouth daily at 12 noon.        Total time spent taking care of this patient: 30 minutes  Signed: Drinda ButtsAmdreas Jaxtin Raimondo 10/09/2016, 10:52 PM

## 2016-10-09 NOTE — OB Triage Note (Signed)
Recvd pt from ED. Pt c/o pressure near her pubic bone. States she noticed bright red bleeding the size of a golf ball in her panties about an hour ago and still feels leaking. Feeling baby move ok. Intercourse this morning.

## 2016-10-09 NOTE — Progress Notes (Signed)
Just prior to discharge patient has another gush of fluid witnessed by nursing staff, nitrazine positive and ferning still negative.  AFI checked 8.21cm.  Will send ROM plus, told to remain supine and would recheck ferning for a third time to verify not missing a high leak.  Has not eaten since 16:00

## 2016-10-09 NOTE — Discharge Summary (Signed)
See final progress note. 

## 2016-10-10 ENCOUNTER — Observation Stay: Payer: Medicaid Other | Admitting: Anesthesiology

## 2016-10-10 ENCOUNTER — Encounter
Admission: EM | Disposition: A | Discharge status: Home / Self Care | Payer: Self-pay | Source: Home / Self Care | Attending: Obstetrics and Gynecology

## 2016-10-10 DIAGNOSIS — Z3A34 34 weeks gestation of pregnancy: Secondary | ICD-10-CM

## 2016-10-10 DIAGNOSIS — K219 Gastro-esophageal reflux disease without esophagitis: Secondary | ICD-10-CM | POA: Diagnosis present

## 2016-10-10 DIAGNOSIS — D62 Acute posthemorrhagic anemia: Secondary | ICD-10-CM | POA: Diagnosis not present

## 2016-10-10 DIAGNOSIS — O9962 Diseases of the digestive system complicating childbirth: Secondary | ICD-10-CM | POA: Diagnosis present

## 2016-10-10 DIAGNOSIS — Z6791 Unspecified blood type, Rh negative: Secondary | ICD-10-CM | POA: Diagnosis not present

## 2016-10-10 DIAGNOSIS — O42111 Preterm premature rupture of membranes, onset of labor more than 24 hours following rupture, first trimester: Secondary | ICD-10-CM | POA: Diagnosis present

## 2016-10-10 DIAGNOSIS — Z87891 Personal history of nicotine dependence: Secondary | ICD-10-CM | POA: Diagnosis not present

## 2016-10-10 DIAGNOSIS — O42913 Preterm premature rupture of membranes, unspecified as to length of time between rupture and onset of labor, third trimester: Secondary | ICD-10-CM | POA: Diagnosis present

## 2016-10-10 DIAGNOSIS — O4693 Antepartum hemorrhage, unspecified, third trimester: Secondary | ICD-10-CM | POA: Diagnosis present

## 2016-10-10 DIAGNOSIS — Z91040 Latex allergy status: Secondary | ICD-10-CM | POA: Diagnosis not present

## 2016-10-10 DIAGNOSIS — Z302 Encounter for sterilization: Secondary | ICD-10-CM

## 2016-10-10 DIAGNOSIS — O9081 Anemia of the puerperium: Secondary | ICD-10-CM | POA: Diagnosis not present

## 2016-10-10 DIAGNOSIS — Z833 Family history of diabetes mellitus: Secondary | ICD-10-CM | POA: Diagnosis not present

## 2016-10-10 DIAGNOSIS — O34211 Maternal care for low transverse scar from previous cesarean delivery: Secondary | ICD-10-CM | POA: Diagnosis present

## 2016-10-10 LAB — CBC WITH DIFFERENTIAL/PLATELET
BASOS PCT: 1 %
Basophils Absolute: 0.1 10*3/uL (ref 0–0.1)
Eosinophils Absolute: 0.3 10*3/uL (ref 0–0.7)
Eosinophils Relative: 2 %
HEMATOCRIT: 32.8 % — AB (ref 35.0–47.0)
Hemoglobin: 10.9 g/dL — ABNORMAL LOW (ref 12.0–16.0)
Lymphocytes Relative: 19 %
Lymphs Abs: 2.8 10*3/uL (ref 1.0–3.6)
MCH: 28 pg (ref 26.0–34.0)
MCHC: 33.2 g/dL (ref 32.0–36.0)
MCV: 84.3 fL (ref 80.0–100.0)
MONO ABS: 1 10*3/uL — AB (ref 0.2–0.9)
MONOS PCT: 7 %
NEUTROS ABS: 10.4 10*3/uL — AB (ref 1.4–6.5)
Neutrophils Relative %: 71 %
Platelets: 194 10*3/uL (ref 150–440)
RBC: 3.89 MIL/uL (ref 3.80–5.20)
RDW: 13.8 % (ref 11.5–14.5)
WBC: 14.5 10*3/uL — ABNORMAL HIGH (ref 3.6–11.0)

## 2016-10-10 LAB — URINE DRUG SCREEN, QUALITATIVE (ARMC ONLY)
AMPHETAMINES, UR SCREEN: NOT DETECTED
BENZODIAZEPINE, UR SCRN: NOT DETECTED
Barbiturates, Ur Screen: NOT DETECTED
Cannabinoid 50 Ng, Ur ~~LOC~~: POSITIVE — AB
Cocaine Metabolite,Ur ~~LOC~~: NOT DETECTED
MDMA (ECSTASY) UR SCREEN: NOT DETECTED
METHADONE SCREEN, URINE: NOT DETECTED
OPIATE, UR SCREEN: NOT DETECTED
Phencyclidine (PCP) Ur S: NOT DETECTED
Tricyclic, Ur Screen: NOT DETECTED

## 2016-10-10 LAB — TYPE AND SCREEN
ABO/RH(D): O POS
ANTIBODY SCREEN: NEGATIVE

## 2016-10-10 LAB — ROM PLUS (ARMC ONLY): ROM PLUS: POSITIVE

## 2016-10-10 SURGERY — Surgical Case
Anesthesia: Spinal

## 2016-10-10 MED ORDER — ACETAMINOPHEN 325 MG PO TABS
650.0000 mg | ORAL_TABLET | Freq: Four times a day (QID) | ORAL | Status: DC
  Administered 2016-10-10 – 2016-10-11 (×3): 650 mg via ORAL
  Filled 2016-10-10 (×3): qty 2

## 2016-10-10 MED ORDER — NALOXONE HCL 0.4 MG/ML IJ SOLN: 0.4000 mg | INTRAMUSCULAR | Status: DC | PRN

## 2016-10-10 MED ORDER — BUPIVACAINE HCL (PF) 0.5 % IJ SOLN
INTRAMUSCULAR | Status: AC
  Filled 2016-10-10: qty 30

## 2016-10-10 MED ORDER — SENNOSIDES-DOCUSATE SODIUM 8.6-50 MG PO TABS
2.0000 | ORAL_TABLET | ORAL | Status: DC
  Administered 2016-10-12 – 2016-10-13 (×3): 2 via ORAL
  Filled 2016-10-10 (×3): qty 2

## 2016-10-10 MED ORDER — NALBUPHINE HCL 10 MG/ML IJ SOLN: 5.0000 mg | INTRAMUSCULAR | Status: DC | PRN

## 2016-10-10 MED ORDER — DIPHENHYDRAMINE HCL 25 MG PO CAPS: 25.0000 mg | ORAL_CAPSULE | Freq: Four times a day (QID) | ORAL | Status: DC | PRN

## 2016-10-10 MED ORDER — NALOXONE HCL 2 MG/2ML IJ SOSY
1.0000 ug/kg/h | PREFILLED_SYRINGE | INTRAVENOUS | Status: DC | PRN
  Filled 2016-10-10: qty 2

## 2016-10-10 MED ORDER — OXYTOCIN 40 UNITS IN LACTATED RINGERS INFUSION - SIMPLE MED
2.5000 [IU]/h | INTRAVENOUS | Status: AC
  Filled 2016-10-10: qty 1000

## 2016-10-10 MED ORDER — KETOROLAC TROMETHAMINE 30 MG/ML IJ SOLN
30.0000 mg | Freq: Four times a day (QID) | INTRAMUSCULAR | Status: AC
  Administered 2016-10-10 – 2016-10-11 (×4): 30 mg via INTRAVENOUS
  Filled 2016-10-10 (×3): qty 1

## 2016-10-10 MED ORDER — MORPHINE SULFATE (PF) 0.5 MG/ML IJ SOLN
INTRAMUSCULAR | Status: AC
  Filled 2016-10-10: qty 10

## 2016-10-10 MED ORDER — IBUPROFEN 600 MG PO TABS
600.0000 mg | ORAL_TABLET | Freq: Four times a day (QID) | ORAL | Status: DC
  Administered 2016-10-11 – 2016-10-14 (×11): 600 mg via ORAL
  Filled 2016-10-10 (×12): qty 1

## 2016-10-10 MED ORDER — SODIUM CHLORIDE 0.9% FLUSH: 3.0000 mL | INTRAVENOUS | Status: DC | PRN

## 2016-10-10 MED ORDER — BUPIVACAINE 0.25 % ON-Q PUMP DUAL CATH 400 ML
400.0000 mL | INJECTION | Status: DC
  Filled 2016-10-10 (×2): qty 400

## 2016-10-10 MED ORDER — OXYCODONE-ACETAMINOPHEN 5-325 MG PO TABS
2.0000 | ORAL_TABLET | ORAL | Status: DC | PRN
Start: 2016-10-11 — End: 2016-10-14
  Administered 2016-10-12 – 2016-10-13 (×2): 2 via ORAL
  Filled 2016-10-10 (×4): qty 2

## 2016-10-10 MED ORDER — FERROUS SULFATE 325 (65 FE) MG PO TABS
325.0000 mg | ORAL_TABLET | Freq: Two times a day (BID) | ORAL | Status: DC
  Administered 2016-10-10 – 2016-10-14 (×8): 325 mg via ORAL
  Filled 2016-10-10 (×8): qty 1

## 2016-10-10 MED ORDER — SIMETHICONE 80 MG PO CHEW
80.0000 mg | CHEWABLE_TABLET | Freq: Three times a day (TID) | ORAL | Status: DC
  Administered 2016-10-10 – 2016-10-13 (×10): 80 mg via ORAL
  Filled 2016-10-10 (×10): qty 1

## 2016-10-10 MED ORDER — OXYTOCIN 40 UNITS IN LACTATED RINGERS INFUSION - SIMPLE MED
INTRAVENOUS | Status: AC
  Filled 2016-10-10: qty 1000

## 2016-10-10 MED ORDER — SODIUM CHLORIDE 0.9 % IJ SOLN
INTRAMUSCULAR | Status: AC
  Filled 2016-10-10: qty 10

## 2016-10-10 MED ORDER — BUPIVACAINE HCL (PF) 0.5 % IJ SOLN
INTRAMUSCULAR | Status: DC | PRN
  Administered 2016-10-10: 10 mL

## 2016-10-10 MED ORDER — WITCH HAZEL-GLYCERIN EX PADS: 1.0000 "application " | MEDICATED_PAD | CUTANEOUS | Status: DC | PRN

## 2016-10-10 MED ORDER — LACTATED RINGERS IV SOLN
INTRAVENOUS | Status: DC
  Administered 2016-10-10 – 2016-10-11 (×2): via INTRAVENOUS

## 2016-10-10 MED ORDER — MORPHINE SULFATE (PF) 0.5 MG/ML IJ SOLN
INTRAMUSCULAR | Status: DC | PRN
  Administered 2016-10-10: .1 mg via INTRATHECAL

## 2016-10-10 MED ORDER — OXYCODONE HCL 5 MG PO TABS: 10.0000 mg | ORAL_TABLET | ORAL | Status: DC | PRN

## 2016-10-10 MED ORDER — CEFAZOLIN SODIUM-DEXTROSE 2-3 GM-% IV SOLR
2.0000 g | INTRAVENOUS | Status: AC
  Administered 2016-10-10: 2 g via INTRAVENOUS
  Filled 2016-10-10: qty 50

## 2016-10-10 MED ORDER — EPHEDRINE SULFATE 50 MG/ML IJ SOLN
INTRAMUSCULAR | Status: AC
  Filled 2016-10-10: qty 1

## 2016-10-10 MED ORDER — FENTANYL CITRATE (PF) 100 MCG/2ML IJ SOLN: 25.0000 ug | INTRAMUSCULAR | Status: DC | PRN

## 2016-10-10 MED ORDER — EPHEDRINE SULFATE 50 MG/ML IJ SOLN
INTRAMUSCULAR | Status: DC | PRN
  Administered 2016-10-10: 10 mg via INTRAVENOUS
  Administered 2016-10-10: 5 mg via INTRAVENOUS

## 2016-10-10 MED ORDER — ONDANSETRON HCL 4 MG/2ML IJ SOLN
INTRAMUSCULAR | Status: DC | PRN
  Administered 2016-10-10: 4 mg via INTRAVENOUS

## 2016-10-10 MED ORDER — DIPHENHYDRAMINE HCL 25 MG PO CAPS
25.0000 mg | ORAL_CAPSULE | ORAL | Status: DC | PRN
  Administered 2016-10-10: 25 mg via ORAL
  Filled 2016-10-10 (×2): qty 1

## 2016-10-10 MED ORDER — MENTHOL 3 MG MT LOZG
1.0000 | LOZENGE | OROMUCOSAL | Status: DC | PRN
  Filled 2016-10-10: qty 9

## 2016-10-10 MED ORDER — LACTATED RINGERS IV SOLN
INTRAVENOUS | Status: DC | PRN
  Administered 2016-10-10 (×2): via INTRAVENOUS

## 2016-10-10 MED ORDER — CEFAZOLIN SODIUM-DEXTROSE 2-4 GM/100ML-% IV SOLN: 2.0000 g | INTRAVENOUS | Status: DC

## 2016-10-10 MED ORDER — FENTANYL CITRATE (PF) 100 MCG/2ML IJ SOLN
INTRAMUSCULAR | Status: AC
  Filled 2016-10-10: qty 2

## 2016-10-10 MED ORDER — PRENATAL MULTIVITAMIN CH
1.0000 | ORAL_TABLET | Freq: Every day | ORAL | Status: DC
  Administered 2016-10-11 – 2016-10-13 (×3): 1 via ORAL
  Filled 2016-10-10 (×3): qty 1

## 2016-10-10 MED ORDER — OXYCODONE HCL 5 MG PO TABS: 5.0000 mg | ORAL_TABLET | ORAL | Status: DC | PRN

## 2016-10-10 MED ORDER — BUPIVACAINE IN DEXTROSE 0.75-8.25 % IT SOLN
INTRATHECAL | Status: DC | PRN
  Administered 2016-10-10: 1.8 mL via INTRATHECAL

## 2016-10-10 MED ORDER — PROPOFOL 10 MG/ML IV BOLUS
INTRAVENOUS | Status: AC
  Filled 2016-10-10: qty 20

## 2016-10-10 MED ORDER — MEPERIDINE HCL 25 MG/ML IJ SOLN: 6.2500 mg | INTRAMUSCULAR | Status: DC | PRN

## 2016-10-10 MED ORDER — DEXTROSE 5 % IV SOLN: INTRAVENOUS | Status: DC | PRN

## 2016-10-10 MED ORDER — PHENYLEPHRINE HCL 10 MG/ML IJ SOLN
INTRAMUSCULAR | Status: DC | PRN
  Administered 2016-10-10 (×2): 100 ug via INTRAVENOUS

## 2016-10-10 MED ORDER — KETOROLAC TROMETHAMINE 30 MG/ML IJ SOLN: 30.0000 mg | Freq: Four times a day (QID) | INTRAMUSCULAR | Status: AC

## 2016-10-10 MED ORDER — SODIUM CHLORIDE 0.9 % IV SOLN
INTRAVENOUS | Status: DC | PRN
  Administered 2016-10-10: 50 ug/min via INTRAVENOUS

## 2016-10-10 MED ORDER — BUPIVACAINE-EPINEPHRINE 0.5% -1:200000 IJ SOLN
20.0000 mL | Freq: Once | INTRAMUSCULAR | Status: DC
  Filled 2016-10-10: qty 20

## 2016-10-10 MED ORDER — COCONUT OIL OIL: 1.0000 "application " | TOPICAL_OIL | Status: DC | PRN

## 2016-10-10 MED ORDER — ONDANSETRON HCL 4 MG/2ML IJ SOLN: 4.0000 mg | Freq: Once | INTRAMUSCULAR | Status: DC | PRN

## 2016-10-10 MED ORDER — OXYCODONE-ACETAMINOPHEN 5-325 MG PO TABS
1.0000 | ORAL_TABLET | ORAL | Status: DC | PRN
  Administered 2016-10-12 – 2016-10-14 (×4): 1 via ORAL
  Filled 2016-10-10 (×2): qty 1

## 2016-10-10 MED ORDER — NALBUPHINE HCL 10 MG/ML IJ SOLN
5.0000 mg | INTRAMUSCULAR | Status: DC | PRN
  Administered 2016-10-10 – 2016-10-11 (×3): 5 mg via INTRAVENOUS
  Filled 2016-10-10 (×2): qty 1

## 2016-10-10 MED ORDER — OXYTOCIN 40 UNITS IN LACTATED RINGERS INFUSION - SIMPLE MED
INTRAVENOUS | Status: DC | PRN
  Administered 2016-10-10: 500 mL via INTRAVENOUS

## 2016-10-10 MED ORDER — SOD CITRATE-CITRIC ACID 500-334 MG/5ML PO SOLN
ORAL | Status: AC
  Administered 2016-10-10: 30 mL
  Filled 2016-10-10: qty 15

## 2016-10-10 MED ORDER — NALBUPHINE HCL 10 MG/ML IJ SOLN: 5.0000 mg | Freq: Once | INTRAMUSCULAR | Status: DC | PRN

## 2016-10-10 MED ORDER — ONDANSETRON HCL 4 MG/2ML IJ SOLN: 4.0000 mg | Freq: Three times a day (TID) | INTRAMUSCULAR | Status: DC | PRN

## 2016-10-10 MED ORDER — FENTANYL CITRATE (PF) 100 MCG/2ML IJ SOLN
INTRAMUSCULAR | Status: DC | PRN
  Administered 2016-10-10: 15 ug via INTRATHECAL

## 2016-10-10 MED ORDER — DIPHENHYDRAMINE HCL 50 MG/ML IJ SOLN: 12.5000 mg | INTRAMUSCULAR | Status: DC | PRN

## 2016-10-10 MED ORDER — DIBUCAINE 1 % RE OINT: 1.0000 "application " | TOPICAL_OINTMENT | RECTAL | Status: DC | PRN

## 2016-10-10 SURGICAL SUPPLY — 34 items
BAG COUNTER SPONGE EZ (MISCELLANEOUS) ×6 IMPLANT
BARRIER ADHS 3X4 INTERCEED (GAUZE/BANDAGES/DRESSINGS) ×3 IMPLANT
CANISTER SUCT 3000ML (MISCELLANEOUS) ×3 IMPLANT
CATH KIT ON-Q SILVERSOAK 5IN (CATHETERS) ×6 IMPLANT
CHLORAPREP W/TINT 26ML (MISCELLANEOUS) ×6 IMPLANT
CLOSURE WOUND 1/2 X4 (GAUZE/BANDAGES/DRESSINGS) ×1
COUNTER SPONGE BAG EZ (MISCELLANEOUS) ×3
DERMABOND ADVANCED (GAUZE/BANDAGES/DRESSINGS) ×2
DERMABOND ADVANCED .7 DNX12 (GAUZE/BANDAGES/DRESSINGS) ×1 IMPLANT
DRSG OPSITE POSTOP 4X10 (GAUZE/BANDAGES/DRESSINGS) ×3 IMPLANT
DRSG TELFA 3X8 NADH (GAUZE/BANDAGES/DRESSINGS) ×3 IMPLANT
ELECT CAUTERY BLADE 6.4 (BLADE) ×3 IMPLANT
ELECT REM PT RETURN 9FT ADLT (ELECTROSURGICAL) ×3
ELECTRODE REM PT RTRN 9FT ADLT (ELECTROSURGICAL) ×1 IMPLANT
GAUZE SPONGE 4X4 12PLY STRL (GAUZE/BANDAGES/DRESSINGS) ×3 IMPLANT
GLOVE BIO SURGEON STRL SZ7 (GLOVE) ×15 IMPLANT
GLOVE INDICATOR 7.5 STRL GRN (GLOVE) ×3 IMPLANT
GOWN STRL REUS W/ TWL LRG LVL3 (GOWN DISPOSABLE) ×3 IMPLANT
GOWN STRL REUS W/TWL LRG LVL3 (GOWN DISPOSABLE) ×6
LIQUID BAND (GAUZE/BANDAGES/DRESSINGS) ×3 IMPLANT
NS IRRIG 1000ML POUR BTL (IV SOLUTION) ×3 IMPLANT
PACK C SECTION AR (MISCELLANEOUS) ×3 IMPLANT
PAD OB MATERNITY 4.3X12.25 (PERSONAL CARE ITEMS) ×3 IMPLANT
PAD PREP 24X41 OB/GYN DISP (PERSONAL CARE ITEMS) ×3 IMPLANT
SPONGE LAP 18X18 5 PK (GAUZE/BANDAGES/DRESSINGS) ×6 IMPLANT
STAPLER INSORB 30 2030 C-SECTI (MISCELLANEOUS) ×3 IMPLANT
STRIP CLOSURE SKIN 1/2X4 (GAUZE/BANDAGES/DRESSINGS) ×2 IMPLANT
SUT MNCRL AB 4-0 PS2 18 (SUTURE) ×3 IMPLANT
SUT PDS AB 1 TP1 96 (SUTURE) ×6 IMPLANT
SUT VIC AB 0 CTX 36 (SUTURE) ×4
SUT VIC AB 0 CTX36XBRD ANBCTRL (SUTURE) ×2 IMPLANT
SUT VIC AB 2-0 CT1 36 (SUTURE) ×3 IMPLANT
SUT VIC AB 3-0 SH 27 (SUTURE) ×2
SUT VIC AB 3-0 SH 27X BRD (SUTURE) ×1 IMPLANT

## 2016-10-10 NOTE — Transfer of Care (Signed)
Immediate Anesthesia Transfer of Care Note  Patient: Brandy Luna  Procedure(s) Performed: Procedure(s): CESAREAN SECTION (N/A)  Patient Location: PACU  Anesthesia Type:Spinal  Level of Consciousness: awake, alert  and oriented  Airway & Oxygen Therapy: Patient Spontanous Breathing  Post-op Assessment: Post -op Vital signs reviewed and stable  Post vital signs: stable  Last Vitals:  Vitals:   10/10/16 1017 10/10/16 1019  BP: (!) 90/57   Pulse: 76 (!) 58  Resp: 14   Temp: 36.6 C 36.6 C    Last Pain:  Vitals:   10/10/16 1017  TempSrc: Temporal  PainSc:          Complications: No apparent anesthesia complications

## 2016-10-10 NOTE — Anesthesia Preprocedure Evaluation (Signed)
Anesthesia Evaluation  Patient identified by MRN, date of birth, ID band Patient awake    Reviewed: Allergy & Precautions, H&P , NPO status , Patient's Chart, lab work & pertinent test results, reviewed documented beta blocker date and time   Airway Mallampati: I  TM Distance: >3 FB Neck ROM: full    Dental  (+) Missing, Teeth Intact   Pulmonary neg pulmonary ROS, former smoker,           Cardiovascular Exercise Tolerance: Good negative cardio ROS       Neuro/Psych PSYCHIATRIC DISORDERS (Depression) negative neurological ROS     GI/Hepatic Neg liver ROS, GERD  ,  Endo/Other  negative endocrine ROS  Renal/GU negative Renal ROS  negative genitourinary   Musculoskeletal   Abdominal   Peds  Hematology  (+) Blood dyscrasia, anemia ,   Anesthesia Other Findings Past Medical History: No date: Anemia No date: Anxiety No date: Depression No date: Pneumonia   Reproductive/Obstetrics (+) Pregnancy                             Anesthesia Physical Anesthesia Plan  ASA: II  Anesthesia Plan: Spinal   Post-op Pain Management:    Induction:   Airway Management Planned:   Additional Equipment:   Intra-op Plan:   Post-operative Plan:   Informed Consent: I have reviewed the patients History and Physical, chart, labs and discussed the procedure including the risks, benefits and alternatives for the proposed anesthesia with the patient or authorized representative who has indicated his/her understanding and acceptance.   Dental Advisory Given  Plan Discussed with: Anesthesiologist, CRNA and Surgeon  Anesthesia Plan Comments:         Anesthesia Quick Evaluation

## 2016-10-10 NOTE — Consult Note (Signed)
Neonatology Note:   Attendance at C-section:    I was asked by Dr. Jackson to attend this repeat C/S at 34 6/7 weeks with PROM and vaginal bleeding. The mother is a 30 y.o. G3P0 [redacted]w[redacted]d, GBS not done with good prenatal care. H/o tobacco and THC use.  ROM 3 hours prior to delivery, fluid clear. Infant vigorous with good spontaneous cry and tone. 30 sec DCC.  Needed only minimal bulb suctioning. Ap 8/9. Lungs clear to ausc in DR. To SCN due to GA for further management.  Father and mother updated and father accompanied us to SCN.   David C. Ehrmann, MD 

## 2016-10-10 NOTE — H&P (Signed)
Obstetric H&P   Chief Complaint: Vaginal bleeding  Prenatal Care Provider: [facility]  History of Present Illness: 31 y.o. G3P0 7830w6d by 11/15/2016, by Ultrasound presenting to L&D leaking of fluid and vaginal bleeding today.  Initial evaluation in triage negative but had witnessed gush, ROM plus and nitrazine positive,  2 prior cesarean section desiring repeat and tubal ligation, paper signed 09/11/16.  +FM, no ctx    Review of Systems: 10 point review of systems negative unless otherwise noted in HPI  Past Medical History: Past Medical History:  Diagnosis Date  . Anemia   . Anxiety   . Depression   . Pneumonia     Past Surgical History: Past Surgical History:  Procedure Laterality Date  . CESAREAN SECTION    . TONSILLECTOMY      Family History: Family History  Problem Relation Age of Onset  . Diabetes Mother   . Hypertension Mother   . Diabetes Maternal Grandmother   . Stroke Maternal Grandmother   . Diabetes Maternal Grandfather   . Stroke Maternal Grandfather     Social History: Social History   Social History  . Marital status: Married    Spouse name: N/A  . Number of children: N/A  . Years of education: N/A   Occupational History  . Not on file.   Social History Main Topics  . Smoking status: Former Smoker    Packs/day: 0.50    Types: Cigarettes  . Smokeless tobacco: Never Used  . Alcohol use No  . Drug use: No  . Sexual activity: Yes   Other Topics Concern  . Not on file   Social History Narrative  . No narrative on file    Medications: Prior to Admission medications   Medication Sig Start Date End Date Taking? Authorizing Provider  cyclobenzaprine (FLEXERIL) 10 MG tablet Take 1/2 to one tablet q8 hours prn 08/07/16  Yes Farrel Connersolleen Gutierrez, CNM  Prenatal Vit-Fe Fumarate-FA (PRENATAL MULTIVITAMIN) TABS tablet Take 1 tablet by mouth daily at 12 noon.   Yes Historical Provider, MD  ondansetron (ZOFRAN) 4 MG tablet Take 1 tablet (4 mg total) by mouth  daily as needed for nausea or vomiting. Patient not taking: Reported on 06/14/2016 03/20/16   Emily FilbertJonathan E Williams, MD  Prenatal Multivit-Min-Fe-FA (PRENATAL VITAMINS) 0.8 MG tablet Take 1 tablet by mouth daily. Patient not taking: Reported on 06/14/2016 03/20/16   Emily FilbertJonathan E Williams, MD  promethazine (PHENERGAN) 25 MG tablet Take 1 tablet (25 mg total) by mouth every 6 (six) hours as needed for nausea or vomiting. Patient not taking: Reported on 06/14/2016 04/26/16   Minna AntisKevin Paduchowski, MD  terconazole (TERAZOL 7) 0.4 % vaginal cream Place 1 applicator vaginally at bedtime. 10/02/16   Nadara Mustardobert P Harris, MD    Allergies: Allergies  Allergen Reactions  . Latex Rash  . Zithromax [Azithromycin] Rash    Pt states gets rash and fever    Physical Exam: Vitals: Blood pressure 115/65, pulse 84, temperature 98.3 F (36.8 C), temperature source Oral, resp. rate 16, height 5\' 4"  (1.626 m), weight 173 lb (78.5 kg), last menstrual period 01/19/2016.  Urine Dip Protein: NA  FHT: 150, mod, +acels, no decels Toco: none  General: NAD HEENT: normocephalic, anicteric Pulmonary: No increased work of breathing Cardiovascular: RRR, distal pulses 2+ Abdomen: Gravid, non-tender Leopolds: vtx confirmed on US for AFI Genitourinary: closed Extremities: no edema, erythema, or tenderness Neurologic: Grossly intact Psychiatric: mood appropriate, affect full  Labs: Results for orders placed or performed during the hospital  encounter of 10/09/16 (from the past 24 hour(s))  ROM Plus ([facility] only)     Status: None   Collection Time: 10/09/16 11:35 PM  Result Value Ref Range   Rom Plus POSITIVE     Assessment: 31 y.o. G3P0 [redacted]w[redacted]d by 11/15/2016, presenting with PPROM  Plan: 1) PPROM proceed with repeat LTCS and BTL. 31 y.o. G3P0  with undesired fertility, desires permanent sterilization.  Other reversible forms of contraception were discussed with patient; she declines all other modalities. Permanent nature of as  well as associated risks of the procedure discussed with patient including but not limited to: risk of regret, permanence of method, bleeding, infection, injury to surrounding organs and need for additional procedures.  Failure risk of 0.5-1% with increased risk of ectopic gestation if pregnancy occurs was also discussed with patient.   - ancef 2 g - On-Q -SCDs  2) Fetus - cat I

## 2016-10-10 NOTE — Discharge Summary (Signed)
OB Discharge Summary     Patient Name: Brandy Luna DOB: 09-May-1986 MRN: [id number]  Date of admission: 10/09/2016 Delivering MD: Thomasene Mohair, MD  Date of Delivery: 10/10/2016  Date of discharge: 10/14/2016  Admitting diagnosis: 34 weeks preg vag bleed [redacted] weeks gestation, ruptured membranes Intrauterine pregnancy: [redacted]w[redacted]d     Secondary diagnosis: None     Discharge diagnosis: Preterm Pregnancy Delivered, Anemia and desires permanent sterilization (status post BTL)                                                                                                Post partum procedures:none  Augmentation: n/a  Complications: None  Hospital course:  The patient was admitted to observation initially and diagnosed with rupture of membranes.  Given her history of two prior cesarean deliveries, she was taken to the OR for repeat cesarean delivery and bilateral tubal ligation.  See operative report for full details.  Her post-operative course was remarkable for severe anemia, from which she was essentially asymptomatic.  On POD#2 she reported one episode of feeling dizzy. This quickly passed and she had no further episodes.  Her vitals were normal. Her blood counts were rechecked and found to be stable.  She was ambulating to and from the NICU, she was voiding spontaneously, her pain was well-controlled on PO pain medication, and she was tolerating PO.  She was deemed an appropriate candidate for discharge.   Physical exam  Vitals:   10/13/16 1700 10/13/16 1929 10/13/16 2302 10/14/16 0533  BP:  112/67 123/78   Pulse:  70 68   Resp:  18 18   Temp: 97.9 F (36.6 C) 98.3 F (36.8 C) 98.6 F (37 C) 98.2 F (36.8 C)  TempSrc: Oral Oral Oral Oral  SpO2:  100% 98%   Weight:      Height:       General: alert, cooperative and no distress Lochia: appropriate Uterine Fundus: firm Incision: Healing well with no significant drainage, No significant erythema, Dressing is clean, dry, and intact DVT  Evaluation: No evidence of DVT seen on physical exam. No cords or calf tenderness. No significant calf/ankle edema.  Labs: Lab Results  Component Value Date   WBC 10.7 10/12/2016   HGB 7.4 (L) 10/12/2016   HCT 22.3 (L) 10/12/2016   MCV 85.0 10/12/2016   PLT 167 10/12/2016    Discharge instruction: per After Visit Summary.  Medications:  Allergies as of 10/14/2016      Reactions   Latex Rash   Zithromax [azithromycin] Rash   Pt states gets rash and fever      Medication List    STOP taking these medications   ondansetron 4 MG tablet Commonly known as:  ZOFRAN   Prenatal Vitamins 0.8 MG tablet   promethazine 25 MG tablet Commonly known as:  PHENERGAN   terconazole 0.4 % vaginal cream Commonly known as:  TERAZOL 7     TAKE these medications   cyclobenzaprine 10 MG tablet Commonly known as:  FLEXERIL Take 1/2 to one tablet q8 hours prn   ibuprofen 600 MG tablet Commonly known as:  ADVIL,MOTRIN Take 1 tablet (600 mg total) by mouth every 6 (six) hours.   oxyCODONE-acetaminophen 5-325 MG tablet Commonly known as:  PERCOCET/ROXICET Take 2 tablets by mouth every 4 (four) hours as needed (breakthrough pain).   prenatal multivitamin Tabs tablet Take 1 tablet by mouth daily at 12 noon.       Diet: routine diet  Activity: Advance as tolerated. Pelvic rest for 6 weeks.   Outpatient follow up: Follow-up Information    Thomasene MohairStephen Malichi Palardy, MD Follow up in 1 week(s).   Specialty:  Obstetrics and Gynecology Why:  post-op incision check Contact information: [address] [place] [address] [phone]             Postpartum contraception: Tubal Ligation Rhogam Given postpartum: no Rubella vaccine given postpartum: no Varicella vaccine given postpartum: no TDaP given antepartum or postpartum: PP  Newborn Data: Live born female  Birth Weight: 4 lb 13.6 oz (2200 g) APGAR: 8, 9   Baby Feeding: Bottle  Disposition:NICU  SIGNED: Thomasene MohairStephen  Hamp Moreland, MD 10/14/2016 9:05 AM

## 2016-10-10 NOTE — Anesthesia Post-op Follow-up Note (Cosign Needed)
Anesthesia QCDR form completed.        

## 2016-10-10 NOTE — Anesthesia Procedure Notes (Addendum)
Spinal  Patient location during procedure: OB Start time: 10/10/2016 7:35 AM End time: 10/10/2016 8:18 AM Staffing Anesthesiologist: Randa Lynn Nevea Spiewak Resident/CRNA: BACHICH, JENNIFER Performed: resident/CRNA  Preanesthetic Checklist Completed: patient identified, site marked, surgical consent, pre-op evaluation, timeout performed, IV checked, risks and benefits discussed and monitors and equipment checked Spinal Block Patient position: sitting Prep: ChloraPrep Patient monitoring: heart rate, continuous pulse ox, blood pressure and cardiac monitor Approach: midline Location: L4-5 Injection technique: single-shot Needle Needle type: Introducer and Whitacre  Needle gauge: 25 G Needle length: 9 cm Additional Notes Negative paresthesia. Negative blood return. Positive free-flowing CSF. Expiration date of kit checked and confirmed. Patient tolerated procedure well, without complications.

## 2016-10-10 NOTE — Op Note (Signed)
Cesarean Section Operative Note    Brandy Luna   10/10/2016   Pre-operative Diagnosis:  1) intrauterine pregnancy at [redacted]w[redacted]d  2) preterm premature rupture of membranes without labor 3) history of cesarean delivery, desires repeat 4) desires permanent sterilization  Post-operative Diagnosis:  1) intrauterine pregnancy at [redacted]w[redacted]d  2) preterm premature rupture of membranes without labor 3) history of cesarean delivery, desires repeat 4) desires permanent sterilization  Procedure:  1) Repeat cesarean delivery with inverted T incision 2) bilateral tubal ligation via Pomeroy method  Surgeon: Surgeon(s) and Role:    * Conard Novak, MD  Anesthesia: spinal   Findings:  1) normal appearing gravid uterus, fallopian tubes, and ovaries 2) viable female infant with APGARs of 8 and 9, weight 2,200 grams in transverse, back-down presentation   Estimated Blood Loss: 1,200 mL  Total IV Fluids: 1,800 ml   Urine Output: 200 mL clear urine at end of case  Specimens: portion of right and left fallopian tubes  Complications: no complications  Disposition: PACU - hemodynamically stable.   Maternal Condition: stable   Baby condition / location:  NICU  Procedure Details:  The patient was seen in the Holding Room. The risks, benefits, complications, treatment options, and expected outcomes were discussed with the patient. The patient concurred with the proposed plan, giving informed consent. identified as Brandy Luna and the procedure verified as C-Section Delivery. A Time Out was held and the above information confirmed.   After induction of anesthesia, the patient was draped and prepped in the usual sterile manner. A Pfannenstiel incision was made and carried down through the subcutaneous tissue to the fascia. Fascial incision was made and extended transversely. The fascia was separated from the underlying rectus tissue superiorly and inferiorly. The peritoneum was identified and  entered. Peritoneal incision was extended longitudinally. The bladder flap was sharply freed from the lower uterine segment. A low transverse uterine incision was made and the hysterotomy was extended with cranial-caudal tension. The fetus was assessed to be in the transverse, back-down presentation.  Rotation attempts were unsuccessful.  An inverted T incision was made with the bandage scissors allowing the fetus to be rotated to cephalic. Delivered was a 2,200 gram Living newborn infant(s) or Female with Apgar scores of 8 at one minute and 9 at five minutes. Cord ph was not sent the umbilical cord was clamped and cut cord blood was obtained for evaluation. The placenta was removed Intact and appeared normal. The uterine outline, tubes and ovaries appeared normal . The uterine incision was closed starting and the cephalad-most portion of the T incision working down to the transverse portion using a 0-Vicryl in a running-locked fashion.  Two layers, not including the serosa, were used to re-approximate the myometrium along the vertical portion of the uterine incision.  The transverse portion of the uterine incision was closed with running locked sutures of 0 Vicryl.  A second layer of the same suture was thrown in an imbricating fashion.  Using a baseball stick with 3-0 Vicryl the serosa of the vertical portion of the closure was re-approximated. Hemostasis was assured.    The tubal ligation portion of the procedure was performed at this point.  The left fallopian tube was identified and followed out to the fimbriated end.  A Babcock clamp was used to grasp the tube in the mid-isthmic portion and two 2-0 plain gut sutures were used to ligate the tube.  An approximately 3cm segment of tube was removed with hemostasis assured.  The same procedure was performed on the right fallopian tube with hemostasis noted.    The incision was re-inspected and the incisions were found to be hemostatic. The uterus was returned  to the abdomen and the paracolic gutters were cleared of all clots and debris.  While in-situ the tubes and incision lines were inspected and found to be hemostatic. The rectus muscles were inspected and found to be hemostatic.  The On-Q catheter pumps were inserted in accordance with the manufacturer's recommendations.  The catheters were inserted approximately 4cm cephelad to the incision line, approximately 1cm apart, straddling the midline.  They were inserted to a depth of the 4th mark. They were positioned superficial to the rectus abdominus muscles and deep to the rectus fascia.    The fascia was then reapproimated with running sutures of 1-0 PDS, looped. The skin was closed using delayed-absorbable staples (InSorb).  The skin closure was reinforced using surgical skin glue.  The On-Q catheters were bolused with 5 mL of 0.5% marcaine plain for a total of 10 mL.  The catheters were affixed to the skin with surgical skin glue, steri-strips, and tegaderm.    Instrument, sponge, and needle counts were correct prior the abdominal closure and were correct at the conclusion of the case.  The patient received Ancef 2 gram IV prior to skin incision (just over one hour passed due to a delay of surgical start). For VTE prophylaxis she was wearing SCDs throughout the case.  Signed: Conard NovakStephen D. Afia Messenger, MD 10/10/2016 10:03 AM

## 2016-10-11 DIAGNOSIS — R1031 Right lower quadrant pain: Secondary | ICD-10-CM | POA: Diagnosis present

## 2016-10-11 LAB — CBC
HCT: 21.8 % — ABNORMAL LOW (ref 35.0–47.0)
Hemoglobin: 7.2 g/dL — ABNORMAL LOW (ref 12.0–16.0)
MCH: 27.8 pg (ref 26.0–34.0)
MCHC: 33 g/dL (ref 32.0–36.0)
MCV: 84.1 fL (ref 80.0–100.0)
PLATELETS: 145 10*3/uL — AB (ref 150–440)
RBC: 2.59 MIL/uL — AB (ref 3.80–5.20)
RDW: 13.5 % (ref 11.5–14.5)
WBC: 15.7 10*3/uL — ABNORMAL HIGH (ref 3.6–11.0)

## 2016-10-11 LAB — SURGICAL PATHOLOGY

## 2016-10-11 NOTE — Progress Notes (Signed)
  Subjective:   Doing well Post Op Day 1. Ambulating short distances without difficulty. Catheter taken out this morning, no void yet. Has been in wheelchair for visits to special care nursery. Denies feeling weak, lightheaded or dizzy. Tolerating PO intake and pain with PO medications and On Q pump.   Objective:  Blood pressure (!) 100/59, pulse 75, temperature 98.1 F (36.7 C), temperature source Oral, resp. rate 18, height 5\' 4"  (1.626 m), weight 173 lb (78.5 kg), last menstrual period 01/19/2016, SpO2 100 %  General: NAD Pulmonary: no increased work of breathing Abdomen: non-distended, non-tender, fundus firm at level of umbilicus Incision: C/D/I, no s/s of infection Extremities: no edema, no erythema, no tenderness  Results for orders placed or performed during the hospital encounter of 10/09/16 (from the past 24 hour(s))  CBC     Status: Abnormal   Collection Time: 10/11/16  5:18 AM  Result Value Ref Range   WBC 15.7 (H) 3.6 - 11.0 K/uL   RBC 2.59 (L) 3.80 - 5.20 MIL/uL   Hemoglobin 7.2 (L) 12.0 - 16.0 g/dL   HCT 86.521.8 (L) 78.435.0 - 69.647.0 %   MCV 84.1 80.0 - 100.0 fL   MCH 27.8 26.0 - 34.0 pg   MCHC 33.0 32.0 - 36.0 g/dL   RDW 29.513.5 28.411.5 - 13.214.5 %   Platelets 145 (L) 150 - 440 K/uL    Intake/Output Summary (Last 24 hours) at 10/11/16 [id number] Last data filed at 10/11/16 0600  Gross per 24 hour  Intake          1016.67 ml  Output             2560 ml  Net         -1543.33 ml     Assessment:   30 y.o. [id number] postoperativeday # 1   Plan:  1) Acute blood loss anemia - hemodynamically stable and asymptomatic - po ferrous sulfate  2) O positive, Rubella Immune, Varicella Immune  3) TDAP status: needs prior to discharge  4) Formula/Contraception: S/P Bilateral Tubal Ligation  5) Disposition: day of discharge to home depending on progress   Brandy Luna, CNM

## 2016-10-11 NOTE — Anesthesia Postprocedure Evaluation (Signed)
Anesthesia Post Note  Patient: Brandy Luna  Procedure(s) Performed: Procedure(s) (LRB): CESAREAN SECTION (N/A)  Patient location during evaluation: Mother Baby Anesthesia Type: Spinal Level of consciousness: awake, awake and alert and oriented Pain management: pain level controlled Vital Signs Assessment: post-procedure vital signs reviewed and stable Respiratory status: spontaneous breathing, nonlabored ventilation and respiratory function stable Cardiovascular status: blood pressure returned to baseline and stable Postop Assessment: no headache and no backache     Last Vitals:  Vitals:   10/11/16 0044 10/11/16 0252  BP: (!) 96/55 101/60  Pulse: 62 67  Resp: 20 18  Temp: 36.6 C 36.4 C    Last Pain:  Vitals:   10/11/16 0252  TempSrc: Oral  PainSc:                  Ginger CarneStephanie Aveion Nguyen

## 2016-10-11 NOTE — Anesthesia Post-op Follow-up Note (Signed)
  Anesthesia Pain Follow-up Note  Patient: Brandy Luna  Day #: 1  Date of Follow-up: 10/11/2016 Time: 7:53 AM  Last Vitals:  Vitals:   10/11/16 0044 10/11/16 0252  BP: (!) 96/55 101/60  Pulse: 62 67  Resp: 20 18  Temp: 36.6 C 36.4 C    Level of Consciousness: alert  Pain: mild   Side Effects:None  Catheter Site Exam:clean, dry     Plan: D/C from anesthesia care at surgeon's request  [facility] Maureen Delatte

## 2016-10-11 NOTE — Clinical Social Work Note (Signed)
The following is the CSW documentation on the patient's newborn [name] MATERNAL/CHILD NOTE  Patient Details  Name: Brandy Luna MRN: [id number] Date of Birth: 10/10/2017  Date:  10/11/2016  Clinical Social Worker Initiating Note:  Shela Leff MSW,LCSW         Date/ Time Initiated:  10/11/16/                 Child's Name:      Legal Guardian:  Mother   Need for Interpreter:  None   Date of Referral:  10/11/16     Reason for Referral:  Other (Comment) (parental support for premature infant)   Referral Source:  NICU   Address:     Phone number:      Household Members: Minor Children, Self, Spouse   Natural Supports (not living in the home): Immediate Family   Professional Supports:None   Employment:Full-time   Type of Work:     Education:      Museum/gallery curator Resources:Self-Pay    Other Resources: [facility]   Cultural/Religious Considerations Which May Impact Care: none  Strengths: Ability to meet basic needs , Compliance with medical plan    Risk Factors/Current Problems: Transportation    Cognitive State: Alert , Able to Concentrate  (tearful)   Mood/Affect: Calm , Labile , Tearful    CSW Assessment:CSW met with patient's mother in the NICU as she was holding and bonding with her newborn. Patient's mother's nurse reported that she had requested to speak with a CSW due to feeling overwhelmed. Patient's mother was tearful in the NICU and began to cry as the physician was explaining that the patient was doing quite well for being born at 23 weeks. Patient's mother expressed that she is sad because she will have to leave her newborn in the NICU and will be at home and has no transportation to get up here to visit patient. CSW provided supportive listening and counseling. Patient states that in the home she has her other two children and her husband. She states her husband works third shift and will  have the car. She stated that patient came so early that they had not had their baby shower as of yet. She expressed that she is feeling overwhelmed because "everything happened so quickly" and that she did not have time to absorb it. CSW informed her that we will look at the [place] bus route to see if one of the stops is close to her home. She reports having some depressed feelings after her second child but that she did not have to go on medication and only saw a therapist as an outpatient for a while.   CSW informed her that I would follow up with her tomorrow in order to let her spend time with her newborn in the NICU this afternoon.  CSW is aware that patient tested positive for marijuana and that patient's cord blood is pending. If cord blood comes back positive for marijuana or any illicit drugs, a DSS CPS report will need to be called. CSW has chosen to wait to discuss this with patient's mother at a more appropriate time.   CSW Plan/Description: Psychosocial Support and Ongoing Assessment of Needs, Information/Referral to [employer], [place] 10/11/2016, 3:42 PM

## 2016-10-12 LAB — CBC
HCT: 22.3 % — ABNORMAL LOW (ref 35.0–47.0)
Hemoglobin: 7.4 g/dL — ABNORMAL LOW (ref 12.0–16.0)
MCH: 28.1 pg (ref 26.0–34.0)
MCHC: 33.1 g/dL (ref 32.0–36.0)
MCV: 85 fL (ref 80.0–100.0)
PLATELETS: 167 10*3/uL (ref 150–440)
RBC: 2.62 MIL/uL — ABNORMAL LOW (ref 3.80–5.20)
RDW: 13.5 % (ref 11.5–14.5)
WBC: 10.7 10*3/uL (ref 3.6–11.0)

## 2016-10-12 MED ORDER — ONDANSETRON 4 MG PO TBDP
4.0000 mg | ORAL_TABLET | Freq: Four times a day (QID) | ORAL | Status: DC | PRN
  Administered 2016-10-12: 4 mg via ORAL
  Filled 2016-10-12: qty 1

## 2016-10-12 MED ORDER — ACETAMINOPHEN 325 MG PO TABS: 650.0000 mg | ORAL_TABLET | Freq: Four times a day (QID) | ORAL | Status: DC | PRN

## 2016-10-12 NOTE — Progress Notes (Signed)
Dr.Stabler notified about original dressing removal due to it being wet.  Dr.Stabler ok with removal.  Site WDL

## 2016-10-12 NOTE — Progress Notes (Signed)
Subjective:  Doing well no concern, minimal lochia, good pain control  Objective:  Blood pressure 112/71, pulse 76, temperature 97.6 F (36.4 C), temperature source Oral, resp. rate 18, height 5\' 4"  (1.626 m), weight 173 lb (78.5 kg), last menstrual period 01/19/2016, SpO2 99 %, unknown if currently breastfeeding.  General: NAD Pulmonary: no increased work of breathing Abdomen: non-distended, non-tender, fundus firm at level of umbilicus Incision: seen in NICU (will re-examine later this afternoon) Extremities: no edema, no erythema, no tenderness  Results for orders placed or performed during the hospital encounter of 10/09/16 (from the past 72 hour(s))  ROM Plus ([facility] only)     Status: None   Collection Time: 10/09/16 11:35 PM  Result Value Ref Range   Rom Plus POSITIVE     Comment: CRITICAL RESULT CALLED TO, READ BACK BY AND VERIFIED WITH: MAURA SANDERS ON 10/10/16 AT 0003 BY TLB   CBC with Differential/Platelet     Status: Abnormal   Collection Time: 10/09/16 11:35 PM  Result Value Ref Range   WBC 14.5 (H) 3.6 - 11.0 K/uL   RBC 3.89 3.80 - 5.20 MIL/uL   Hemoglobin 10.9 (L) 12.0 - 16.0 g/dL   HCT 29.5 (L) 62.1 - 30.8 %   MCV 84.3 80.0 - 100.0 fL   MCH 28.0 26.0 - 34.0 pg   MCHC 33.2 32.0 - 36.0 g/dL   RDW 65.7 84.6 - 96.2 %   Platelets 194 150 - 440 K/uL   Neutrophils Relative % 71 %   Neutro Abs 10.4 (H) 1.4 - 6.5 K/uL   Lymphocytes Relative 19 %   Lymphs Abs 2.8 1.0 - 3.6 K/uL   Monocytes Relative 7 %   Monocytes Absolute 1.0 (H) 0.2 - 0.9 K/uL   Eosinophils Relative 2 %   Eosinophils Absolute 0.3 0 - 0.7 K/uL   Basophils Relative 1 %   Basophils Absolute 0.1 0 - 0.1 K/uL  Urine Drug Screen, Qualitative ([facility] only)     Status: Abnormal   Collection Time: 10/10/16 12:15 AM  Result Value Ref Range   Tricyclic, Ur Screen NONE DETECTED NONE DETECTED   Amphetamines, Ur Screen NONE DETECTED NONE DETECTED   MDMA (Ecstasy)Ur Screen NONE DETECTED NONE DETECTED   Cocaine  Metabolite,Ur [place] NONE DETECTED NONE DETECTED   Opiate, Ur Screen NONE DETECTED NONE DETECTED   Phencyclidine (PCP) Ur S NONE DETECTED NONE DETECTED   Cannabinoid 50 Ng, Ur [place] POSITIVE (A) NONE DETECTED   Barbiturates, Ur Screen NONE DETECTED NONE DETECTED   Benzodiazepine, Ur Scrn NONE DETECTED NONE DETECTED   Methadone Scn, Ur NONE DETECTED NONE DETECTED    Comment: (NOTE) 100  Tricyclics, urine               Cutoff 1000 ng/mL 200  Amphetamines, urine             Cutoff 1000 ng/mL 300  MDMA (Ecstasy), urine           Cutoff 500 ng/mL 400  Cocaine Metabolite, urine       Cutoff 300 ng/mL 500  Opiate, urine                   Cutoff 300 ng/mL 600  Phencyclidine (PCP), urine      Cutoff 25 ng/mL 700  Cannabinoid, urine              Cutoff 50 ng/mL 800  Barbiturates, urine  Cutoff 200 ng/mL 900  Benzodiazepine, urine           Cutoff 200 ng/mL 1000 Methadone, urine                Cutoff 300 ng/mL 1100 1200 The urine drug screen provides only a preliminary, unconfirmed 1300 analytical test result and should not be used for non-medical 1400 purposes. Clinical consideration and professional judgment should 1500 be applied to any positive drug screen result due to possible 1600 interfering substances. A more specific alternate chemical method 1700 must be used in order to obtain a confirmed analytical result.  1800 Gas chromato graphy / mass spectrometry (GC/MS) is the preferred 1900 confirmatory method.   Type and screen [facility]     Status: None   Collection Time: 10/10/16  1:00 AM  Result Value Ref Range   ABO/RH(D) O POS    Antibody Screen NEG    Sample Expiration 10/13/2016   Surgical pathology     Status: None   Collection Time: 10/10/16  7:32 AM  Result Value Ref Range   SURGICAL PATHOLOGY      Surgical Pathology CASE: [id number] PATIENT: Brandy Luna Surgical Pathology Report     SPECIMEN SUBMITTED: A. Fallopian tube segment,  right B. Fallopian tube segment, left  CLINICAL HISTORY: None provided  PRE-OPERATIVE DIAGNOSIS: [redacted] weeks gestation, ruptured membranes  POST-OPERATIVE DIAGNOSIS: Same as pre-op     DIAGNOSIS: A. RIGHT FALLOPIAN TUBE SEGMENT; LIGATION: - FALLOPIAN TUBE SEGMENT WITH FULL CROSS SECTION OF THE LUMEN.  B. LEFT FALLOPIAN TUBE SEGMENT; LIGATION: - FALLOPIAN TUBE SEGMENT WITH FULL CROSS SECTION OF THE LUMEN.   GROSS DESCRIPTION: A. Labeled: Right fallopian segment  Size: 2.3 x 0.5-0.7 cm  Other findings: Unremarkable portion of fallopian tube without fimbria, sectioned  Block summary: 1- representative cross-sections  B. Labeled: Left fallopian segment  Size: 2 x 0.6 cm  Other findings: Segment of pink red unremarkable fallopian tube without fimbria, sectioned  Block summary: 1- representative cross-sectio ns Final Diagnosis performed by Glenice Bowana Baker, MD.  Electronically signed 10/11/2016 9:55:34AM    The electronic signature indicates that the named Attending Pathologist has evaluated the specimen  Technical component performed at [facility], [address], [place], [place] [address] Lab: [phone] Dir: Titus DubinWilliam F. Cato MulliganHancock, MD  Professional component performed at [facility], [facility], [address], [place], [place] [address] Lab: [phone] Dir: Georgiann Cockerara C. Rubinas, MD    CBC     Status: Abnormal   Collection Time: 10/11/16  5:18 AM  Result Value Ref Range   WBC 15.7 (H) 3.6 - 11.0 K/uL   RBC 2.59 (L) 3.80 - 5.20 MIL/uL   Hemoglobin 7.2 (L) 12.0 - 16.0 g/dL    Comment: RESULT REPEATED AND VERIFIED   HCT 21.8 (L) 35.0 - 47.0 %   MCV 84.1 80.0 - 100.0 fL   MCH 27.8 26.0 - 34.0 pg   MCHC 33.0 32.0 - 36.0 g/dL   RDW 72.513.5 36.611.5 - 44.014.5 %   Platelets 145 (L) 150 - 440 K/uL     Assessment:   31 y.o. [id number] postoperativeday # 2 repeat C-section & BTL, T-shaped uterine for PPROM at 34 weeks   Plan:  1) Acute blood loss anemia -  hemodynamically stable and asymptomatic - po ferrous sulfate  2) Blood Type --/--/O POS (03/14 0100) / Rubella  Immune / Varicella Immune  3) TDAP status need  4) Bottle / s/p BTL  5) Disposition anticipate discharge POD4

## 2016-10-13 NOTE — Progress Notes (Signed)
Earlier this morning patient was complaining that [facility] and [facility]onatologist in there were not explaining her baby's plan of care. [facility],Dr.  Maryan CharLindsey Murphy and Dr Eulah PontMurphy came to patients room and talked with her with RN present and answered all patient's questions.Patient seemed to understand and was content after this as far has how her baby was doing but at times during the day was rather mean to staff if supplies not brought in immediately that she asked for. There seemed to be no understanding if nurses were currently with another patient but her behavior changed from happy to frustrated often. A little later patient was complaining to RN [facility] today and also that they had not been in at all to see her since her C section. RN reported this to Dr Thomasene MohairStephen Jackson. He said that he had tried to round on her earlier today  but she was in [facility] and he was planning to come back and round on her this afternoon. He came up immediately after RNs call and saw paitent with RN present and  answered all her questions. Patient also has been teary eyed at times today  and this was reported to Dr Jean RosenthalJackson along with her unusual changing mood and behavior. She seems to be a little better ever since Dr Edison PaceJackson's visit at 1400 today. Staff asked by Dr Jean RosenthalJackson to report to MD any behavior out of the ordinary.  RN will pass this on in report tonight.

## 2016-10-13 NOTE — Plan of Care (Signed)
Problem: Role Relationship: Goal: Ability to demonstrate positive interaction with newborn will improve Outcome: Progressing Visiting Infant in [facility] for long Intervals.

## 2016-10-13 NOTE — Progress Notes (Signed)
Patient ID: Brandy Luna, female   DOB: 1986/07/08, 31 y.o.   MRN: [id number]  Obstetric Postpartum/PostOperative Daily Progress Note Subjective:  31 y.o. [id number] status post repeat cesarean delivery at 34 weeks for PPROM.  She is ambulating, is tolerating po, is voiding spontaneously.  Her pain is well controlled on PO pain medications. Her lochia is less than menses.  She has ambulated to the NICU multiple times without difficulty. She reports an episode of feeling dizzy yesterday right before a shower and this resolved when she laid down.  Denies HAs and feeling lightheaded.  She denies SOB.   She states she had odd leg pain yesterday and when she was in the shower her legs "locked up." However, she did not fall and after taking pain medication her pain resolved.  She has not had any repeat episodes.     Medications SCHEDULED MEDICATIONS  . ferrous sulfate  325 mg Oral BID WC  . ibuprofen  600 mg Oral Q6H  . prenatal multivitamin  1 tablet Oral Q1200  . senna-docusate  2 tablet Oral Q24H  . simethicone  80 mg Oral TID PC    MEDICATION INFUSIONS  . bupivacaine 0.25 % ON-Q pump DUAL CATH 400 mL      PRN MEDICATIONS  acetaminophen, coconut oil, witch hazel-glycerin **AND** dibucaine, diphenhydrAMINE, menthol-cetylpyridinium, ondansetron, oxyCODONE-acetaminophen **OR** oxyCODONE-acetaminophen    Objective:   Vitals:   10/12/16 2240 10/13/16 0451 10/13/16 0808 10/13/16 1232  BP: (!) 112/55  116/62 107/64  Pulse: 66  62 (!) 58  Resp:   18 17  Temp:  97.7 F (36.5 C) 98.6 F (37 C) 97.9 F (36.6 C)  TempSrc:  Oral Oral Oral  SpO2:   100%   Weight:      Height:        Current Vital Signs 24h Vital Sign Ranges  T 97.9 F (36.6 C) Temp  Avg: 98.1 F (36.7 C)  Min: 97.7 F (36.5 C)  Max: 98.6 F (37 C)  BP 107/64 BP  Min: 107/64  Max: 116/62  HR (!) 58 Pulse  Avg: 62  Min: 58  Max: 66  RR 17 Resp  Avg: 19  Min: 17  Max: 22  SaO2 100 % Not Delivered SpO2  Avg: 100 %  Min: 100 %   Max: 100 %       24 Hour I/O Current Shift I/O  Time Ins Outs No intake/output data recorded. No intake/output data recorded.  General: NAD Pulmonary: no increased work of breathing Abdomen: non-distended, non-tender, fundus firm at level of umbilicus Inc: Clean/dry/intact Extremities: no edema, no erythema, no tenderness  Labs:   Recent Labs Lab 10/09/16 2335 10/11/16 0518 10/12/16 1404  WBC 14.5* 15.7* 10.7  HGB 10.9* 7.2* 7.4*  HCT 32.8* 21.8* 22.3*  PLT 194 145* 167    Assessment:   30 y.o. G3P0103 postoperative day # 2, s/p repeat cesarean section  Plan:  1) Acute blood loss anemia - hemodynamically stable and asymptomatic - po ferrous sulfate - discussed feeling dizzy as a potential symptom of severe anemia.   2) O POS / Rubella  / Varicella Immune  3) TDAP status needs  4) formula feeding /Contraception = s/p BTL with c-section  5) Disposition: Discharge tomorrow  I had a long discussion with the patient with the RN present regarding any and all concerns she wished to express about her care and any problems she might be having.  She has reported to her RN that  no one has come to see her since her surgery.  She also complained that she did not understand the plan for her infant.  The neonatologist has recently come to see the patient and per the RN report, the neonatologist spent 30 minutes with the patient outlining the progress of the infant and the plan moving forward.  When I spoke with the patient she voiced relief and now having a better understanding of the plan with her infant.  I offered her to use a wheelchair when going to the NICU, if she felt afraid that she would get dizzy and fall.  She voiced that she would appreciate that.  I encouraged her to ambulate around the hallway, but recommend she have an assistant walk with her from the hospital staff.  She voiced that she would do that.  Discussed her leg pain and we will continue to keep an eye on that,  though it appears to be better with pain medication. Discussed her severe anemia based on her blood loss and discussed that we will continue to monitor symptoms. However, her lack of symptoms the vast majority of the time with exertion, and the stability of her blood counts, and her vital signs are all very reassuring.   I asked her if she had any other concerns at all or wanted anything at all addressed and she stated that she had no other concerns nor questions.  The RN reported to me that the patient has been somewhat scattered in her thinking based on what she has said.  She has had multiple complaints that seem disparate.  She has had some behavior that is somewhat out of the norm.  This patient has a history of depression.  However, she was very appropriate when I spoke with her.  She had no tangential communication, was not responding to internal stimuli.  However, we will offer a psych evaluation should she display any other odd behavior concerning for psychosis. I do not think she is displaying any of symptoms of this at present.   Conard NovakStephen D. Jackson, MD 10/13/2016 1:42 PM

## 2016-10-14 MED ORDER — OXYCODONE-ACETAMINOPHEN 5-325 MG PO TABS: 2.0000 | ORAL_TABLET | ORAL | 0 refills | Status: AC | PRN

## 2016-10-14 MED ORDER — TETANUS-DIPHTH-ACELL PERTUSSIS 5-2.5-18.5 LF-MCG/0.5 IM SUSP
0.5000 mL | INTRAMUSCULAR | Status: AC | PRN
  Administered 2016-10-14: 0.5 mL via INTRAMUSCULAR
  Filled 2016-10-14 (×2): qty 0.5

## 2016-10-14 MED ORDER — IBUPROFEN 600 MG PO TABS: 600.0000 mg | ORAL_TABLET | Freq: Four times a day (QID) | ORAL | 0 refills | Status: AC

## 2016-10-14 NOTE — Progress Notes (Signed)
D/c inst reviewed with pt.  Pt verb u/o of how to remove On Q pump.  Rx given for home use.  Pt going back to [facility] to see baby one last time for today.

## 2016-10-14 NOTE — Progress Notes (Signed)
Discharge to home.  Ambulatory to car

## 2016-10-16 ENCOUNTER — Encounter: Payer: Medicaid Other | Admitting: Advanced Practice Midwife

## 2016-10-17 ENCOUNTER — Encounter: Payer: Medicaid Other | Admitting: Obstetrics and Gynecology

## 2016-11-08 ENCOUNTER — Ambulatory Visit: Payer: Medicaid Other | Admitting: Obstetrics and Gynecology

## 2016-11-08 ENCOUNTER — Other Ambulatory Visit: Payer: Medicaid Other

## 2016-11-09 ENCOUNTER — Inpatient Hospital Stay: Admit: 2016-11-09 | Payer: Medicaid Other | Admitting: Obstetrics and Gynecology

## 2016-11-09 SURGERY — Surgical Case
Anesthesia: Choice

## 2016-11-21 ENCOUNTER — Ambulatory Visit: Payer: Medicaid Other | Admitting: Obstetrics and Gynecology

## 2016-11-23 LAB — IGP, APTIMA HPV, RFX 16/18,45
HPV Aptima: NEGATIVE
PAP Smear Comment: 0

## 2016-11-27 ENCOUNTER — Telehealth: Payer: Self-pay | Admitting: Obstetrics and Gynecology

## 2016-11-27 DIAGNOSIS — A5901 Trichomonal vulvovaginitis: Secondary | ICD-10-CM | POA: Insufficient documentation

## 2016-11-27 MED ORDER — METRONIDAZOLE 500 MG PO TABS: 2000.0000 mg | ORAL_TABLET | Freq: Once | ORAL | 0 refills | Status: AC

## 2016-11-27 NOTE — Telephone Encounter (Signed)
Discussed normal pap smear findings. Also, discussed finding of trichomonas and treatment.  Instructed the patient not to engage in intercourse until her partner had been treated and she has allowed about 7 days to pass after both had been treated. Discussed EtOH and disulfuram-like reaction potential. She voiced understanding and agreement.   Rx sent in.

## 2017-03-17 ENCOUNTER — Encounter: Payer: Self-pay | Admitting: Emergency Medicine

## 2017-03-17 ENCOUNTER — Emergency Department
Admission: EM | Admit: 2017-03-17 | Discharge: 2017-03-17 | Disposition: A | Discharge status: Home / Self Care | Payer: Medicaid Other | Attending: Emergency Medicine | Admitting: Emergency Medicine

## 2017-03-17 DIAGNOSIS — J00 Acute nasopharyngitis [common cold]: Secondary | ICD-10-CM

## 2017-03-17 DIAGNOSIS — F17210 Nicotine dependence, cigarettes, uncomplicated: Secondary | ICD-10-CM | POA: Insufficient documentation

## 2017-03-17 DIAGNOSIS — R110 Nausea: Secondary | ICD-10-CM | POA: Insufficient documentation

## 2017-03-17 LAB — URINALYSIS, COMPLETE (UACMP) WITH MICROSCOPIC
Bacteria, UA: NONE SEEN
Bilirubin Urine: NEGATIVE
Glucose, UA: NEGATIVE mg/dL
HGB URINE DIPSTICK: NEGATIVE
Ketones, ur: NEGATIVE mg/dL
Leukocytes, UA: NEGATIVE
Nitrite: NEGATIVE
PROTEIN: NEGATIVE mg/dL
SPECIFIC GRAVITY, URINE: 1.005 (ref 1.005–1.030)
pH: 7 (ref 5.0–8.0)

## 2017-03-17 LAB — POCT PREGNANCY, URINE: PREG TEST UR: NEGATIVE

## 2017-03-17 MED ORDER — ACETAMINOPHEN 325 MG PO TABS
650.0000 mg | ORAL_TABLET | Freq: Once | ORAL | Status: AC
Start: 2017-03-17 — End: 2017-03-17
  Administered 2017-03-17: 650 mg via ORAL
  Filled 2017-03-17: qty 2

## 2017-03-17 MED ORDER — ONDANSETRON 4 MG PO TBDP: 4.0000 mg | ORAL_TABLET | Freq: Three times a day (TID) | ORAL | 0 refills | Status: DC | PRN

## 2017-03-17 MED ORDER — AMOXICILLIN 500 MG PO TABS: 500.0000 mg | ORAL_TABLET | Freq: Two times a day (BID) | ORAL | 0 refills | Status: AC

## 2017-03-17 MED ORDER — AMOXICILLIN 500 MG PO TABS: 500.0000 mg | ORAL_TABLET | Freq: Two times a day (BID) | ORAL | 0 refills | Status: DC

## 2017-03-17 MED ORDER — ONDANSETRON 8 MG PO TBDP
8.0000 mg | ORAL_TABLET | Freq: Once | ORAL | Status: AC
  Administered 2017-03-17: 8 mg via ORAL
  Filled 2017-03-17: qty 1

## 2017-03-17 NOTE — Discharge Instructions (Signed)
Take medication as prescribed. Return to emergency department if symptoms worsen and follow-up with PCP as needed.   °

## 2017-03-17 NOTE — ED Provider Notes (Signed)
[facility] Emergency Department Provider Note   ____________________________________________   I have reviewed the triage vital signs and the nursing notes.   HISTORY  Chief Complaint Nasal Congestion and Nausea    HPI Brandy Luna is a 31 y.o. female presents to the emergency department with complaints of malaise, body aches, cough, nausea, headache and nasal congestion that started earlier today. 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 Patient feels she may have contracted an infection from one of the residents. Patient denies fever, chills, vision changes, chest pain, chest tightness, shortness of breath, abdominal pain, and vomiting.  Past Medical History:  Diagnosis Date  . Anemia   . Anxiety   . Depression   . Pneumonia     Patient Active Problem List   Diagnosis Date Noted  . Trichomonas vaginalis (TV) infection 11/27/2016  . Right lower quadrant pain 10/11/2016  . Preterm premature rupture of membranes (PPROM) with onset of labor after 24 hours of rupture in first trimester, antepartum 10/10/2016  . [redacted] weeks gestation of pregnancy 10/10/2016  . Labor and delivery indication for care or intervention 10/09/2016  . High risk pregnancy due to assisted reproductive technology, antepartum 10/02/2016  . History of cesarean delivery 10/02/2016  . History of substance abuse 10/02/2016  . Vaginal discharge 10/02/2016  . Yeast vaginitis 10/02/2016  . Indication for care in labor or delivery 08/07/2016  . Encounter for antenatal screening 05/03/2016    Past Surgical History:  Procedure Laterality Date  . CESAREAN SECTION    . CESAREAN SECTION N/A 10/10/2016   Procedure: CESAREAN SECTION;  Surgeon: Vena Austria, MD;  Location: [facility] ORS;  Service: Obstetrics;  Laterality: N/A;  . CESAREAN SECTION    . TONSILLECTOMY      Prior to Admission medications   Medication Sig Start Date End Date Taking?  Authorizing Provider  amoxicillin (AMOXIL) 500 MG tablet Take 1 tablet (500 mg total) by mouth 2 (two) times daily. 03/17/17 03/27/17  Little, Traci M, PA-C  cyclobenzaprine (FLEXERIL) 10 MG tablet Take 1/2 to one tablet q8 hours prn 08/07/16   Farrel Conners, CNM  ibuprofen (ADVIL,MOTRIN) 600 MG tablet Take 1 tablet (600 mg total) by mouth every 6 (six) hours. 10/14/16   Conard Novak, MD  ondansetron (ZOFRAN ODT) 4 MG disintegrating tablet Take 1 tablet (4 mg total) by mouth every 8 (eight) hours as needed for nausea or vomiting. 03/17/17   Little, Traci M, PA-C  oxyCODONE-acetaminophen (PERCOCET/ROXICET) 5-325 MG tablet Take 2 tablets by mouth every 4 (four) hours as needed (breakthrough pain). 10/14/16   Conard Novak, MD  Prenatal Vit-Fe Fumarate-FA (PRENATAL MULTIVITAMIN) TABS tablet Take 1 tablet by mouth daily at 12 noon.    [provider]    Allergies Latex and Zithromax [azithromycin]  Family History  Problem Relation Age of Onset  . Diabetes Mother   . Hypertension Mother   . Diabetes Maternal Grandmother   . Stroke Maternal Grandmother   . Diabetes Maternal Grandfather   . Stroke Maternal Grandfather     Social History Social History  Substance Use Topics  . Smoking status: Current Every Day Smoker    Packs/day: 0.50    Types: Cigarettes  . Smokeless tobacco: Never Used  . Alcohol use No    Review of Systems Constitutional: Negative for fever/chills. Malaise and body ache. Eyes: No visual changes. ENT:  Negative for sore throat and for difficulty swallowing Cardiovascular: Denies chest pain. Respiratory:  Positive for cough. Denies shortness of breath. Gastrointestinal: No abdominal pain.  Positive for nausea however no vomiting or diarrhea.. Genitourinary: Negative for dysuria. Musculoskeletal: Negative for back pain. Skin: Negative for rash. Neurological: Positive for headaches.  ____________________________________________   PHYSICAL  EXAM:  VITAL SIGNS: ED Triage Vitals  Enc Vitals Group     BP 03/17/17 2022 137/87     Pulse Rate 03/17/17 2021 75     Resp 03/17/17 2021 18     Temp 03/17/17 2021 98.4 F (36.9 C)     Temp Source 03/17/17 2021 Oral     SpO2 03/17/17 2021 100 %     Weight 03/17/17 2021 168 lb (76.2 kg)     Height 03/17/17 2021 5\' 4"  (1.626 m)     Head Circumference --      Peak Flow --      Pain Score 03/17/17 2021 10     Pain Loc --      Pain Edu? --      Excl. in GC? --     Constitutional: Alert and oriented. Mild distress. Eyes: Conjunctivae are normal. PERRL. EOMI  Head: Normocephalic and atraumatic. ENT:      Ears: Canals clear. TMs intact bilaterally.      Nose: Congestion without rhinorrhea       Mouth/Throat: Mucous membranes are moist. Oropharynx erythematous Neck:Supple. No stridor.  Cardiovascular: Normal rate, regular rhythm. Normal S1 and S2.  Good peripheral circulation. Respiratory: Normal respiratory effort without tachypnea or retractions. Lungs CTAB. No wheezes/rales/rhonchi. Good air entry to the bases with no decreased or absent breath sounds. Nonproductive cough. Hematological/Lymphatic/Immunological: No cervical lymphadenopathy. Cardiovascular: Normal rate, regular rhythm. Normal distal pulses. Gastrointestinal: Bowel sounds 4 quadrants. Soft and nontender to palpation. No guarding or rigidity. No palpable masses. No distention. No CVA tenderness. Musculoskeletal: Nontender with normal range of motion in all extremities. Neurologic: Normal speech and language. Skin:  Skin is warm, dry and intact. No rash noted. Psychiatric: Mood and affect are normal. Speech and behavior are normal. Patient exhibits appropriate insight and judgement.  ____________________________________________   LABS (all labs ordered are listed, but only abnormal results are displayed)  Labs Reviewed  URINALYSIS, COMPLETE (UACMP) WITH MICROSCOPIC - Abnormal; Notable for the following:        Result Value   Color, Urine YELLOW (*)    APPearance CLEAR (*)    Squamous Epithelial / LPF 0-5 (*)    All other components within normal limits  POC URINE PREG, ED  POCT PREGNANCY, URINE   ____________________________________________  EKG None ____________________________________________  RADIOLOGY None ____________________________________________   PROCEDURES  Procedure(s) performed: No    Critical Care performed: no ____________________________________________   INITIAL IMPRESSION / ASSESSMENT AND PLAN / ED COURSE  Pertinent labs & imaging results that were available during my care of the patient were reviewed by me and considered in my medical decision making (see chart for details).   Patient presents to the emergency department body aches, congestion, nausea, and headache. History, physical exam and labs are reassuring symptoms are consistent with upper respiratory infection/nasopharyngitis. Patient will be prescribed amoxicillin for antibiotic coverage Zofran for nausea as needed. Recommend patient to utilize OTC cold and sinus for symptom management as needed. Physical exam and vital signes were reassuring at this time. Patient informed of clinical course, understand medical decision-making process, and agree with plan.  Patient was advised to follow up with PCP as needed and was also advised to return to the emergency department for  symptoms that change or worsen.    ____________________________________________   FINAL CLINICAL IMPRESSION(S) / ED DIAGNOSES  Final diagnoses:  Nausea  Acute nasopharyngitis       NEW MEDICATIONS STARTED DURING THIS VISIT:  Discharge Medication List as of 03/17/2017 10:52 PM    START taking these medications   Details  ondansetron (ZOFRAN ODT) 4 MG disintegrating tablet Take 1 tablet (4 mg total) by mouth every 8 (eight) hours as needed for nausea or vomiting., Starting Sun 03/17/2017, Print         Note:  This  document was prepared using Dragon voice recognition software and may include unintentional dictation errors.   Clois Comber, PA-C 03/18/17 4540    Jene Every, MD 03/25/17 1415

## 2017-03-17 NOTE — ED Notes (Signed)
Patient verbalizes understanding of discharge instructions, prescriptions, home care and follow up care. Patient out of department at this time. 

## 2017-10-09 ENCOUNTER — Encounter: Payer: Self-pay | Admitting: Emergency Medicine

## 2017-10-09 ENCOUNTER — Other Ambulatory Visit: Payer: Self-pay

## 2017-10-09 ENCOUNTER — Emergency Department
Admission: EM | Admit: 2017-10-09 | Discharge: 2017-10-09 | Disposition: A | Discharge status: Home / Self Care | Payer: Medicaid Other | Attending: Emergency Medicine | Admitting: Emergency Medicine

## 2017-10-09 DIAGNOSIS — B9789 Other viral agents as the cause of diseases classified elsewhere: Secondary | ICD-10-CM

## 2017-10-09 DIAGNOSIS — R05 Cough: Secondary | ICD-10-CM | POA: Diagnosis not present

## 2017-10-09 DIAGNOSIS — F17210 Nicotine dependence, cigarettes, uncomplicated: Secondary | ICD-10-CM | POA: Diagnosis not present

## 2017-10-09 DIAGNOSIS — J069 Acute upper respiratory infection, unspecified: Secondary | ICD-10-CM | POA: Diagnosis not present

## 2017-10-09 DIAGNOSIS — J029 Acute pharyngitis, unspecified: Secondary | ICD-10-CM

## 2017-10-09 LAB — INFLUENZA PANEL BY PCR (TYPE A & B)
Influenza A By PCR: NEGATIVE
Influenza B By PCR: NEGATIVE

## 2017-10-09 LAB — GROUP A STREP BY PCR: GROUP A STREP BY PCR: NOT DETECTED

## 2017-10-09 MED ORDER — HYDROCOD POLST-CPM POLST ER 10-8 MG/5ML PO SUER
5.0000 mL | Freq: Once | ORAL | Status: AC
  Administered 2017-10-09: 5 mL via ORAL
  Filled 2017-10-09: qty 5

## 2017-10-09 MED ORDER — HYDROCOD POLST-CPM POLST ER 10-8 MG/5ML PO SUER: 5.0000 mL | Freq: Two times a day (BID) | ORAL | 0 refills | Status: AC

## 2017-10-09 NOTE — Discharge Instructions (Signed)
1.  You may take Tussionex as needed for cough. 2.  You may take Tylenol and/or Ibuprofen as needed for discomfort. 3.  Return to the ER for worsening symptoms, persistent vomiting, difficulty breathing or other concerns.

## 2017-10-09 NOTE — ED Notes (Signed)
Pt to the er for sore throat, body aches, sob, cough, congestion, body aches, fever. Pt took sudafed once at home but it didn't work. Pt states she is hurting badly. Pt says she has a productive cough that is thick and white.

## 2017-10-09 NOTE — ED Provider Notes (Signed)
[facility] Emergency Department Provider Note   ____________________________________________   First MD Initiated Contact with Patient 10/09/17 0542     (approximate)  I have reviewed the triage vital signs and the nursing notes.   HISTORY  Chief Complaint Sore Throat    HPI Brandy Luna is a 32 y.o. female who presents to the ED from home with a chief complaint of body aches, chills, sore throat, cough and congestion.  Onset of symptoms yesterday.  Complains of cough productive of white sputum, sore throat, nasal congestion and body aches.  Denies fever, chest pain, shortness of breath, abdominal pain, nausea, vomiting, diarrhea.  Denies recent travel or trauma. + sick contacts.   Past Medical History:  Diagnosis Date  . Anemia   . Anxiety   . Depression   . Pneumonia     Patient Active Problem List   Diagnosis Date Noted  . Trichomonas vaginalis (TV) infection 11/27/2016  . Right lower quadrant pain 10/11/2016  . Preterm premature rupture of membranes (PPROM) with onset of labor after 24 hours of rupture in first trimester, antepartum 10/10/2016  . [redacted] weeks gestation of pregnancy 10/10/2016  . Labor and delivery indication for care or intervention 10/09/2016  . High risk pregnancy due to assisted reproductive technology, antepartum 10/02/2016  . History of cesarean delivery 10/02/2016  . History of substance abuse 10/02/2016  . Vaginal discharge 10/02/2016  . Yeast vaginitis 10/02/2016  . Indication for care in labor or delivery 08/07/2016  . Encounter for antenatal screening 05/03/2016    Past Surgical History:  Procedure Laterality Date  . CESAREAN SECTION    . CESAREAN SECTION N/A 10/10/2016   Procedure: CESAREAN SECTION;  Surgeon: Vena Austria, MD;  Location: [facility] ORS;  Service: Obstetrics;  Laterality: N/A;  . CESAREAN SECTION    . TONSILLECTOMY      Prior to Admission medications   Medication Sig Start Date End Date  Taking? Authorizing Provider  chlorpheniramine-HYDROcodone (TUSSIONEX PENNKINETIC ER) 10-8 MG/5ML SUER Take 5 mLs by mouth 2 (two) times daily. 10/09/17   Irean Hong, MD  cyclobenzaprine (FLEXERIL) 10 MG tablet Take 1/2 to one tablet q8 hours prn 08/07/16   Farrel Conners, CNM  ibuprofen (ADVIL,MOTRIN) 600 MG tablet Take 1 tablet (600 mg total) by mouth every 6 (six) hours. 10/14/16   Conard Novak, MD  ondansetron (ZOFRAN ODT) 4 MG disintegrating tablet Take 1 tablet (4 mg total) by mouth every 8 (eight) hours as needed for nausea or vomiting. 03/17/17   Little, Traci M, PA-C  oxyCODONE-acetaminophen (PERCOCET/ROXICET) 5-325 MG tablet Take 2 tablets by mouth every 4 (four) hours as needed (breakthrough pain). 10/14/16   Conard Novak, MD  Prenatal Vit-Fe Fumarate-FA (PRENATAL MULTIVITAMIN) TABS tablet Take 1 tablet by mouth daily at 12 noon.    [provider]    Allergies Latex and Zithromax [azithromycin]  Family History  Problem Relation Age of Onset  . Diabetes Mother   . Hypertension Mother   . Diabetes Maternal Grandmother   . Stroke Maternal Grandmother   . Diabetes Maternal Grandfather   . Stroke Maternal Grandfather     Social History Social History   Tobacco Use  . Smoking status: Current Every Day Smoker    Packs/day: 0.50    Types: Cigarettes  . Smokeless tobacco: Never Used  Substance Use Topics  . Alcohol use: No  . Drug use: Yes    Types: Marijuana    Review of Systems  Constitutional:  Positive for body aches and chills. Eyes: No visual changes. ENT: Positive for sore throat. Cardiovascular: Denies chest pain. Respiratory: Positive for cough.  Denies shortness of breath. Gastrointestinal: No abdominal pain.  No nausea, no vomiting.  No diarrhea.  No constipation. Genitourinary: Negative for dysuria. Musculoskeletal: Negative for back pain. Skin: Negative for rash. Neurological: Negative for headaches, focal weakness or  numbness.   ____________________________________________   PHYSICAL EXAM:  VITAL SIGNS: ED Triage Vitals  Enc Vitals Group     BP 10/09/17 0315 113/68     Pulse Rate 10/09/17 0315 88     Resp 10/09/17 0315 20     Temp 10/09/17 0315 98.2 F (36.8 C)     Temp Source 10/09/17 0315 Oral     SpO2 10/09/17 0315 100 %     Weight 10/09/17 0313 180 lb (81.6 kg)     Height 10/09/17 0313 5\' 4"  (1.626 m)     Head Circumference --      Peak Flow --      Pain Score 10/09/17 0313 10     Pain Loc --      Pain Edu? --      Excl. in GC? --     Constitutional: Alert and oriented. Well appearing and in no acute distress. Eyes: Conjunctivae are normal. PERRL. EOMI. Head: Atraumatic. Nose: Congestion/rhinnorhea. Mouth/Throat: Mucous membranes are moist.  Oropharynx mildly erythematous without tonsillar swelling, exudates or peritonsillar abscess.  There is no hoarse or muffled voice.  There is no drooling. Neck: No stridor.  Supple neck without meningismus. Hematological/Lymphatic/Immunilogical: No cervical lymphadenopathy. Cardiovascular: Normal rate, regular rhythm. Grossly normal heart sounds.  Good peripheral circulation. Respiratory: Normal respiratory effort.  No retractions. Lungs CTAB. Gastrointestinal: Soft and nontender. No distention. No abdominal bruits. No CVA tenderness. Musculoskeletal: No lower extremity tenderness nor edema.  No joint effusions. Neurologic:  Normal speech and language. No gross focal neurologic deficits are appreciated. No gait instability. Skin:  Skin is warm, dry and intact. No rash noted.  No petechiae. Psychiatric: Mood and affect are normal. Speech and behavior are normal.  ____________________________________________   LABS (all labs ordered are listed, but only abnormal results are displayed)  Labs Reviewed  GROUP A STREP BY PCR  INFLUENZA PANEL BY PCR (TYPE A & B)    ____________________________________________  EKG  None ____________________________________________  RADIOLOGY  ED MD interpretation: None  Official radiology report(s): No results found.  ____________________________________________   PROCEDURES  Procedure(s) performed: None  Procedures  Critical Care performed: No  ____________________________________________   INITIAL IMPRESSION / ASSESSMENT AND PLAN / ED COURSE  As part of my medical decision making, I reviewed the following data within the electronic MEDICAL RECORD NUMBER Nursing notes reviewed and incorporated, Labs reviewed  and Notes from prior ED visits   32 year old female who presents with viral illness with cough and sore throat.  Influenza and rapid strep swabs are negative.  Will treat with Tussionex for cough, advised Tylenol/ibuprofen for discomfort.  Strict return precautions given.  Patient verbalizes understanding and agrees with plan of care.      ____________________________________________   FINAL CLINICAL IMPRESSION(S) / ED DIAGNOSES  Final diagnoses:  Sore throat  Viral URI with cough     ED Discharge Orders        Ordered    chlorpheniramine-HYDROcodone (TUSSIONEX PENNKINETIC ER) 10-8 MG/5ML SUER  2 times daily     10/09/17 0618       Note:  This document was prepared using Dragon  voice recognition software and may include unintentional dictation errors.    Irean HongSung, Jade J, MD 10/09/17 [phone]

## 2017-10-09 NOTE — ED Triage Notes (Signed)
Pt to triage via w/c with no distress noted; pt reports since yesterday having body aches, chills, sore throat and congestion

## 2017-10-09 NOTE — ED Notes (Signed)
Patient is resting comfortably. 

## 2018-01-17 ENCOUNTER — Emergency Department
Admission: EM | Admit: 2018-01-17 | Discharge: 2018-01-17 | Disposition: A | Discharge status: Home / Self Care | Payer: Medicaid Other | Attending: Emergency Medicine | Admitting: Emergency Medicine

## 2018-01-17 ENCOUNTER — Emergency Department: Payer: Medicaid Other

## 2018-01-17 ENCOUNTER — Encounter: Payer: Self-pay | Admitting: Emergency Medicine

## 2018-01-17 DIAGNOSIS — F17210 Nicotine dependence, cigarettes, uncomplicated: Secondary | ICD-10-CM | POA: Insufficient documentation

## 2018-01-17 DIAGNOSIS — Y9302 Activity, running: Secondary | ICD-10-CM | POA: Diagnosis not present

## 2018-01-17 DIAGNOSIS — Y999 Unspecified external cause status: Secondary | ICD-10-CM | POA: Insufficient documentation

## 2018-01-17 DIAGNOSIS — S8391XA Sprain of unspecified site of right knee, initial encounter: Secondary | ICD-10-CM | POA: Insufficient documentation

## 2018-01-17 DIAGNOSIS — S8991XA Unspecified injury of right lower leg, initial encounter: Secondary | ICD-10-CM | POA: Diagnosis present

## 2018-01-17 DIAGNOSIS — Y929 Unspecified place or not applicable: Secondary | ICD-10-CM | POA: Diagnosis not present

## 2018-01-17 DIAGNOSIS — W01198A Fall on same level from slipping, tripping and stumbling with subsequent striking against other object, initial encounter: Secondary | ICD-10-CM | POA: Insufficient documentation

## 2018-01-17 MED ORDER — MELOXICAM 15 MG PO TABS: 15.0000 mg | ORAL_TABLET | Freq: Every day | ORAL | 0 refills | Status: AC

## 2018-01-17 MED ORDER — HYDROCODONE-ACETAMINOPHEN 5-325 MG PO TABS
1.0000 | ORAL_TABLET | Freq: Once | ORAL | Status: AC
  Administered 2018-01-17: 1 via ORAL
  Filled 2018-01-17: qty 1

## 2018-01-17 MED ORDER — MELOXICAM 7.5 MG PO TABS
15.0000 mg | ORAL_TABLET | Freq: Once | ORAL | Status: AC
Start: 2018-01-17 — End: 2018-01-17
  Administered 2018-01-17: 15 mg via ORAL
  Filled 2018-01-17: qty 2

## 2018-01-17 NOTE — ED Notes (Signed)
Pt placed on bedpan to urinate.

## 2018-01-17 NOTE — ED Provider Notes (Signed)
[facility] Emergency Department Provider Note  ____________________________________________  Time seen: Approximately 7:10 PM  I have reviewed the triage vital signs and the nursing notes.   HISTORY  Chief Complaint Leg Pain    HPI Brandy Luna is a 32 y.o. female who presents the emergency department complaining of right knee and leg pain.  Patient reports that she had to urinate, was rushing to the bathroom and slipped on water on the floor.  Patient is unsure how she landed on her knee.  Initially, patient had anterior knee pain that has now radiated to the entire leg.  She did not hit her head or lose consciousness.  Patient denies any previous injury or surgery to the right lower extremity.  No medications for this complaint prior to arrival.  No other injury or complaint at this time.    Past Medical History:  Diagnosis Date  . Anemia   . Anxiety   . Depression   . Pneumonia     Patient Active Problem List   Diagnosis Date Noted  . Trichomonas vaginalis (TV) infection 11/27/2016  . Right lower quadrant pain 10/11/2016  . Preterm premature rupture of membranes (PPROM) with onset of labor after 24 hours of rupture in first trimester, antepartum 10/10/2016  . [redacted] weeks gestation of pregnancy 10/10/2016  . Labor and delivery indication for care or intervention 10/09/2016  . High risk pregnancy due to assisted reproductive technology, antepartum 10/02/2016  . History of cesarean delivery 10/02/2016  . History of substance abuse 10/02/2016  . Vaginal discharge 10/02/2016  . Yeast vaginitis 10/02/2016  . Indication for care in labor or delivery 08/07/2016  . Encounter for antenatal screening 05/03/2016    Past Surgical History:  Procedure Laterality Date  . CESAREAN SECTION    . CESAREAN SECTION N/A 10/10/2016   Procedure: CESAREAN SECTION;  Surgeon: Vena Austria, MD;  Location: [facility] ORS;  Service: Obstetrics;  Laterality: N/A;  . CESAREAN  SECTION    . TONSILLECTOMY      Prior to Admission medications   Medication Sig Start Date End Date Taking? Authorizing Provider  chlorpheniramine-HYDROcodone (TUSSIONEX PENNKINETIC ER) 10-8 MG/5ML SUER Take 5 mLs by mouth 2 (two) times daily. 10/09/17   Irean Hong, MD  cyclobenzaprine (FLEXERIL) 10 MG tablet Take 1/2 to one tablet q8 hours prn 08/07/16   Farrel Conners, CNM  ibuprofen (ADVIL,MOTRIN) 600 MG tablet Take 1 tablet (600 mg total) by mouth every 6 (six) hours. 10/14/16   Conard Novak, MD  meloxicam (MOBIC) 15 MG tablet Take 1 tablet (15 mg total) by mouth daily. 01/17/18   , Delorise Royals, PA-C  ondansetron (ZOFRAN ODT) 4 MG disintegrating tablet Take 1 tablet (4 mg total) by mouth every 8 (eight) hours as needed for nausea or vomiting. 03/17/17   Little, Traci M, PA-C  oxyCODONE-acetaminophen (PERCOCET/ROXICET) 5-325 MG tablet Take 2 tablets by mouth every 4 (four) hours as needed (breakthrough pain). 10/14/16   Conard Novak, MD  Prenatal Vit-Fe Fumarate-FA (PRENATAL MULTIVITAMIN) TABS tablet Take 1 tablet by mouth daily at 12 noon.    [provider]    Allergies Latex and Zithromax [azithromycin]  Family History  Problem Relation Age of Onset  . Diabetes Mother   . Hypertension Mother   . Diabetes Maternal Grandmother   . Stroke Maternal Grandmother   . Diabetes Maternal Grandfather   . Stroke Maternal Grandfather     Social History Social History   Tobacco Use  . Smoking  status: Current Every Day Smoker    Packs/day: 0.50    Types: Cigarettes  . Smokeless tobacco: Never Used  Substance Use Topics  . Alcohol use: No  . Drug use: Yes    Types: Marijuana     Review of Systems  Constitutional: No fever/chills Eyes: No visual changes.  Cardiovascular: no chest pain. Respiratory: no cough. No SOB. Gastrointestinal: No abdominal pain.  No nausea, no vomiting.   Musculoskeletal: Positive for right knee and right lower extremity  pain Skin: Negative for rash, abrasions, lacerations, ecchymosis. Neurological: Negative for headaches, focal weakness or numbness. 10-point ROS otherwise negative.  ____________________________________________   PHYSICAL EXAM:  VITAL SIGNS: ED Triage Vitals  Enc Vitals Group     BP 01/17/18 1814 133/85     Pulse Rate 01/17/18 1814 71     Resp 01/17/18 1814 18     Temp 01/17/18 1814 98.9 F (37.2 C)     Temp Source 01/17/18 1814 Oral     SpO2 01/17/18 1814 96 %     Weight 01/17/18 1815 180 lb (81.6 kg)     Height 01/17/18 1815 5\' 4"  (1.626 m)     Head Circumference --      Peak Flow --      Pain Score 01/17/18 1814 10     Pain Loc --      Pain Edu? --      Excl. in GC? --      Constitutional: Alert and oriented. Well appearing and in no acute distress. Eyes: Conjunctivae are normal. PERRL. EOMI. Head: Atraumatic. Neck: No stridor.    Cardiovascular: Normal rate, regular rhythm. Normal S1 and S2.  Good peripheral circulation. Respiratory: Normal respiratory effort without tachypnea or retractions. Lungs CTAB. Good air entry to the bases with no decreased or absent breath sounds. Musculoskeletal: Full range of motion to all extremities. No gross deformities appreciated.  Visible deformity, edema, ecchymosis noted to the right knee or right lower extremity.  Patient is limited range of motion due to pain.  Patient has good passive range of motion.  Palpation over the knee reveals tenderness globally with no specific point tenderness.  No palpable abnormality.  No ballottement.  Varus, valgus, Lachman's, McMurray's is negative.  Examination of the right hip and right ankle is unremarkable as well.  Dorsalis pedis pulse intact.  Sensation intact all digits right lower extremity. Neurologic:  Normal speech and language. No gross focal neurologic deficits are appreciated.  Skin:  Skin is warm, dry and intact. No rash noted. Psychiatric: Mood and affect are normal. Speech and behavior  are normal. Patient exhibits appropriate insight and judgement.   ____________________________________________   LABS (all labs ordered are listed, but only abnormal results are displayed)  Labs Reviewed - No data to display ____________________________________________  EKG   ____________________________________________  RADIOLOGY I personally viewed and evaluated these images as part of my medical decision making, as well as reviewing the written report by the radiologist.  I concur with radiologist of no acute osseous abnormality.  No visible knee effusion.  Dg Knee Complete 4 Views Right  Result Date: 01/17/2018 CLINICAL DATA:  Fall with knee pain EXAM: RIGHT KNEE - COMPLETE 4+ VIEW COMPARISON:  None. FINDINGS: No fracture or malalignment. No significant knee effusion. Joint spaces are maintained. IMPRESSION: No acute osseous abnormality Electronically Signed   By: Jasmine Pang M.D.   On: 01/17/2018 19:03    ____________________________________________    PROCEDURES  Procedure(s) performed:    Procedures  Medications  HYDROcodone-acetaminophen (NORCO/VICODIN) 5-325 MG per tablet 1 tablet (has no administration in time range)  meloxicam (MOBIC) tablet 15 mg (has no administration in time range)     ____________________________________________   INITIAL IMPRESSION / ASSESSMENT AND PLAN / ED COURSE  Pertinent labs & imaging results that were available during my care of the patient were reviewed by me and considered in my medical decision making (see chart for details).  Review of the [place] CSRS was performed in accordance of the [facility] prior to dispensing any controlled drugs.      Patient's diagnosis is consistent with right knee strain.  Patient presents emergency department complaining of right knee pain after a fall.  Exam is reassuring.  X-ray reveals no acute osseous abnormality.  Diagnosis is most consistent with strain.  Initial differential included  fracture versus dislocation versus sprain versus ligament rupture.. Patient will be discharged home with prescriptions for meloxicam.  Patient is given crutches for ambulation. Patient is to follow up with primary care or orthopedics as needed or otherwise directed. Patient is given ED precautions to return to the ED for any worsening or new symptoms.     ____________________________________________  FINAL CLINICAL IMPRESSION(S) / ED DIAGNOSES  Final diagnoses:  Sprain of right knee, unspecified ligament, initial encounter      NEW MEDICATIONS STARTED DURING THIS VISIT:  ED Discharge Orders        Ordered    meloxicam (MOBIC) 15 MG tablet  Daily     01/17/18 1921          This chart was dictated using voice recognition software/Dragon. Despite best efforts to proofread, errors can occur which can change the meaning. Any change was purely unintentional.    Racheal PatchesCuthriell,  D, PA-C 01/17/18 1926    Minna AntisPaduchowski, Kevin, MD 01/17/18 [phone]

## 2018-01-17 NOTE — ED Notes (Signed)
Pt reports slipping in urine on the bathroom floor of social services. Pt reports pain in right knee that radiates to her right hip and foot. Pt reports she can not feel her right foot and states that it is numb. Color and pulse intact and equal to left foot. No swelling noted and no deformities.

## 2018-01-17 NOTE — ED Triage Notes (Signed)
Pt arrived after mechanical fall that resulted in pt falling on right knee. No deformity noted to leg, pedal pulses palpable and strong.

## 2018-01-17 NOTE — ED Notes (Signed)
This RN reviewed discharge instructions, follow-up care, prescriptions, cryotherapy, and need for elevation with patient. Patient verbalized understanding of all reviewed information.  Patient stable, with no distress noted at this time. 

## 2018-01-22 ENCOUNTER — Emergency Department: Payer: Medicaid Other

## 2018-01-22 ENCOUNTER — Emergency Department
Admission: EM | Admit: 2018-01-22 | Discharge: 2018-01-22 | Disposition: A | Discharge status: Home / Self Care | Payer: Medicaid Other | Attending: Student in an Organized Health Care Education/Training Program | Admitting: Student in an Organized Health Care Education/Training Program

## 2018-01-22 ENCOUNTER — Encounter: Payer: Self-pay | Admitting: Emergency Medicine

## 2018-01-22 DIAGNOSIS — F17210 Nicotine dependence, cigarettes, uncomplicated: Secondary | ICD-10-CM | POA: Insufficient documentation

## 2018-01-22 DIAGNOSIS — R42 Dizziness and giddiness: Secondary | ICD-10-CM | POA: Diagnosis present

## 2018-01-22 DIAGNOSIS — L298 Other pruritus: Secondary | ICD-10-CM | POA: Insufficient documentation

## 2018-01-22 DIAGNOSIS — Z79899 Other long term (current) drug therapy: Secondary | ICD-10-CM | POA: Insufficient documentation

## 2018-01-22 DIAGNOSIS — A599 Trichomoniasis, unspecified: Secondary | ICD-10-CM

## 2018-01-22 DIAGNOSIS — R0602 Shortness of breath: Secondary | ICD-10-CM | POA: Insufficient documentation

## 2018-01-22 DIAGNOSIS — A5900 Urogenital trichomoniasis, unspecified: Secondary | ICD-10-CM | POA: Diagnosis not present

## 2018-01-22 DIAGNOSIS — L299 Pruritus, unspecified: Secondary | ICD-10-CM

## 2018-01-22 LAB — CBC
HCT: 38.7 % (ref 35.0–47.0)
Hemoglobin: 12.7 g/dL (ref 12.0–16.0)
MCH: 28 pg (ref 26.0–34.0)
MCHC: 32.7 g/dL (ref 32.0–36.0)
MCV: 85.6 fL (ref 80.0–100.0)
PLATELETS: 189 10*3/uL (ref 150–440)
RBC: 4.52 MIL/uL (ref 3.80–5.20)
RDW: 15.1 % — AB (ref 11.5–14.5)
WBC: 5.9 10*3/uL (ref 3.6–11.0)

## 2018-01-22 LAB — BASIC METABOLIC PANEL
Anion gap: 9 (ref 5–15)
BUN: 7 mg/dL (ref 6–20)
CHLORIDE: 108 mmol/L (ref 98–111)
CO2: 21 mmol/L — ABNORMAL LOW (ref 22–32)
CREATININE: 0.57 mg/dL (ref 0.44–1.00)
Calcium: 8.5 mg/dL — ABNORMAL LOW (ref 8.9–10.3)
Glucose, Bld: 76 mg/dL (ref 70–99)
Potassium: 3.6 mmol/L (ref 3.5–5.1)
SODIUM: 138 mmol/L (ref 135–145)

## 2018-01-22 LAB — URINALYSIS, COMPLETE (UACMP) WITH MICROSCOPIC
BILIRUBIN URINE: NEGATIVE
GLUCOSE, UA: NEGATIVE mg/dL
KETONES UR: NEGATIVE mg/dL
NITRITE: NEGATIVE
PH: 5 (ref 5.0–8.0)
Protein, ur: NEGATIVE mg/dL
SPECIFIC GRAVITY, URINE: 1.014 (ref 1.005–1.030)

## 2018-01-22 LAB — CHLAMYDIA/NGC RT PCR (ARMC ONLY)
CHLAMYDIA TR: NOT DETECTED
N gonorrhoeae: NOT DETECTED

## 2018-01-22 LAB — POCT PREGNANCY, URINE: PREG TEST UR: NEGATIVE

## 2018-01-22 LAB — WET PREP, GENITAL
Clue Cells Wet Prep HPF POC: NONE SEEN
SPERM: NONE SEEN
Yeast Wet Prep HPF POC: NONE SEEN

## 2018-01-22 MED ORDER — METRONIDAZOLE 500 MG PO TABS: 500.0000 mg | ORAL_TABLET | Freq: Two times a day (BID) | ORAL | 0 refills | Status: AC

## 2018-01-22 MED ORDER — ONDANSETRON HCL 4 MG PO TABS: 4.0000 mg | ORAL_TABLET | Freq: Every day | ORAL | 0 refills | Status: AC | PRN

## 2018-01-22 MED ORDER — SODIUM CHLORIDE 0.9 % IV BOLUS
500.0000 mL | Freq: Once | INTRAVENOUS | Status: AC
  Administered 2018-01-22: 500 mL via INTRAVENOUS

## 2018-01-22 NOTE — ED Triage Notes (Signed)
Pt arrived with complaints of dizziness and shortness of breath since Friday. Pt attributes the symptoms to her Vicodin. Pt's breathing unlabored and regular. Lung sounds clear. Pt's responses seem delayed an pt is drowsy but speech clear and appropriate. Grips equal and strong, no facial droop, no weakness observed, pt alert and oriented x4.

## 2018-01-22 NOTE — ED Notes (Addendum)
Pt states was seen on Friday for "terrible fall", was prescribed an anti-inflammatory, doesn't know name of it. States since started medicine she feels weak when standing for longer than 5-10 minutes, state especially R leg which is what she fell on. Alert, oriented, speaking in complete sentences. No resp distress noted, pt states SOB in addition to the weakness. 100% on RA while talking. Pt also c/o vaginal itching.

## 2018-01-22 NOTE — ED Notes (Signed)
Pt ambulatory to toilet with steady gait noted.  

## 2018-01-22 NOTE — ED Notes (Signed)
X-ray at bedside

## 2018-01-22 NOTE — ED Notes (Signed)
Pt ambulatory to toilet to collect urine sample.  

## 2018-01-22 NOTE — ED Provider Notes (Signed)
[facility] Emergency Department Provider Note    First MD Initiated Contact with Patient 01/22/18 1147     (approximate)  I have reviewed the triage vital signs and the nursing notes.   HISTORY  Chief Complaint Dizziness and Shortness of Breath    HPI Brandy Luna is a 32 y.o. female with a history of anemia, anxiety and depression presents to the ER after she had a fall on Friday.  Was started on Percocet.  Did not hit her head but states that since taking that medication is felt lightheaded and dizzy whenever she stands.  She is also concerned about itching of her extremities as well as itching in "paradise" which she points to her groin area.  Denies any discharge.  No chest pain.  States that she has felt short of breath since starting this medication.  Denies any numbness or tingling.      Past Medical History:  Diagnosis Date  . Anemia   . Anxiety   . Depression   . Pneumonia    Family History  Problem Relation Age of Onset  . Diabetes Mother   . Hypertension Mother   . Diabetes Maternal Grandmother   . Stroke Maternal Grandmother   . Diabetes Maternal Grandfather   . Stroke Maternal Grandfather    Past Surgical History:  Procedure Laterality Date  . CESAREAN SECTION    . CESAREAN SECTION N/A 10/10/2016   Procedure: CESAREAN SECTION;  Surgeon: Vena Austria, MD;  Location: [facility] ORS;  Service: Obstetrics;  Laterality: N/A;  . CESAREAN SECTION    . TONSILLECTOMY     Patient Active Problem List   Diagnosis Date Noted  . Trichomonas vaginalis (TV) infection 11/27/2016  . Right lower quadrant pain 10/11/2016  . Preterm premature rupture of membranes (PPROM) with onset of labor after 24 hours of rupture in first trimester, antepartum 10/10/2016  . [redacted] weeks gestation of pregnancy 10/10/2016  . Labor and delivery indication for care or intervention 10/09/2016  . High risk pregnancy due to assisted reproductive technology, antepartum  10/02/2016  . History of cesarean delivery 10/02/2016  . History of substance abuse 10/02/2016  . Vaginal discharge 10/02/2016  . Yeast vaginitis 10/02/2016  . Indication for care in labor or delivery 08/07/2016  . Encounter for antenatal screening 05/03/2016      Prior to Admission medications   Medication Sig Start Date End Date Taking? Authorizing Provider  meloxicam (MOBIC) 15 MG tablet Take 1 tablet (15 mg total) by mouth daily. 01/17/18  Yes Cuthriell, Delorise Royals, PA-C  chlorpheniramine-HYDROcodone (TUSSIONEX PENNKINETIC ER) 10-8 MG/5ML SUER Take 5 mLs by mouth 2 (two) times daily. Patient not taking: Reported on 01/22/2018 10/09/17   Irean Hong, MD  cyclobenzaprine (FLEXERIL) 10 MG tablet Take 1/2 to one tablet q8 hours prn Patient not taking: Reported on 01/22/2018 08/07/16   Farrel Conners, CNM  ibuprofen (ADVIL,MOTRIN) 600 MG tablet Take 1 tablet (600 mg total) by mouth every 6 (six) hours. Patient not taking: Reported on 01/22/2018 10/14/16   Conard Novak, MD  metroNIDAZOLE (FLAGYL) 500 MG tablet Take 1 tablet (500 mg total) by mouth 2 (two) times daily for 7 days. 01/22/18 01/29/18  Willy Eddy, MD  ondansetron (ZOFRAN ODT) 4 MG disintegrating tablet Take 1 tablet (4 mg total) by mouth every 8 (eight) hours as needed for nausea or vomiting. Patient not taking: Reported on 01/22/2018 03/17/17   Little, Traci M, PA-C  ondansetron (ZOFRAN) 4 MG tablet Take 1  tablet (4 mg total) by mouth daily as needed for nausea or vomiting. 01/22/18 01/22/19  Willy Eddy, MD  oxyCODONE-acetaminophen (PERCOCET/ROXICET) 5-325 MG tablet Take 2 tablets by mouth every 4 (four) hours as needed (breakthrough pain). Patient not taking: Reported on 01/22/2018 10/14/16   Conard Novak, MD    Allergies Latex and Zithromax [azithromycin]    Social History Social History   Tobacco Use  . Smoking status: Current Every Day Smoker    Packs/day: 0.50    Types: Cigarettes  . Smokeless  tobacco: Never Used  Substance Use Topics  . Alcohol use: No  . Drug use: Yes    Types: Marijuana    Review of Systems Patient denies headaches, rhinorrhea, blurry vision, numbness, shortness of breath, chest pain, edema, cough, abdominal pain, nausea, vomiting, diarrhea, dysuria, fevers, rashes or hallucinations unless otherwise stated above in HPI. ____________________________________________   PHYSICAL EXAM:  VITAL SIGNS: Vitals:   01/22/18 1108 01/22/18 1153  BP: 115/66 118/77  Pulse: 67 79  Resp: 18 16  Temp: 98.4 F (36.9 C)   SpO2: 100% 100%    Constitutional: Alert and oriented.  Eyes: Conjunctivae are normal.  Head: Atraumatic. Nose: No congestion/rhinnorhea. Mouth/Throat: Mucous membranes are moist.   Neck: No stridor. Painless ROM.  Cardiovascular: Normal rate, regular rhythm. Grossly normal heart sounds.  Good peripheral circulation. Respiratory: Normal respiratory effort.  No retractions. Lungs CTAB. Gastrointestinal: Soft and nontender. No distention. No abdominal bruits. No CVA tenderness. Genitourinary: Normal external genitalia with no discharge or erythema Musculoskeletal: No lower extremity tenderness nor edema.  No joint effusions. Neurologic:  Normal speech and language. No gross focal neurologic deficits are appreciated. No facial droop Skin:  Skin is warm, dry and intact. No rash noted. Psychiatric: Mood and affect are normal. Speech and behavior are normal.  ____________________________________________   LABS (all labs ordered are listed, but only abnormal results are displayed)  Results for orders placed or performed during the hospital encounter of 01/22/18 (from the past 24 hour(s))  Urinalysis, Complete w Microscopic     Status: Abnormal   Collection Time: 01/22/18 11:13 AM  Result Value Ref Range   Color, Urine YELLOW (A) YELLOW   APPearance HAZY (A) CLEAR   Specific Gravity, Urine 1.014 1.005 - 1.030   pH 5.0 5.0 - 8.0   Glucose, UA  NEGATIVE NEGATIVE mg/dL   Hgb urine dipstick SMALL (A) NEGATIVE   Bilirubin Urine NEGATIVE NEGATIVE   Ketones, ur NEGATIVE NEGATIVE mg/dL   Protein, ur NEGATIVE NEGATIVE mg/dL   Nitrite NEGATIVE NEGATIVE   Leukocytes, UA SMALL (A) NEGATIVE   RBC / HPF 0-5 0 - 5 RBC/hpf   WBC, UA 0-5 0 - 5 WBC/hpf   Bacteria, UA RARE (A) NONE SEEN   Squamous Epithelial / LPF 6-10 0 - 5   Mucus PRESENT   Wet prep, genital     Status: Abnormal   Collection Time: 01/22/18 12:28 PM  Result Value Ref Range   Yeast Wet Prep HPF POC NONE SEEN NONE SEEN   Trich, Wet Prep PRESENT (A) NONE SEEN   Clue Cells Wet Prep HPF POC NONE SEEN NONE SEEN   WBC, Wet Prep HPF POC FEW (A) NONE SEEN   Sperm NONE SEEN   Basic metabolic panel     Status: Abnormal   Collection Time: 01/22/18 12:42 PM  Result Value Ref Range   Sodium 138 135 - 145 mmol/L   Potassium 3.6 3.5 - 5.1 mmol/L   Chloride 108  98 - 111 mmol/L   CO2 21 (L) 22 - 32 mmol/L   Glucose, Bld 76 70 - 99 mg/dL   BUN 7 6 - 20 mg/dL   Creatinine, Ser 4.090.57 0.44 - 1.00 mg/dL   Calcium 8.5 (L) 8.9 - 10.3 mg/dL   GFR calc non Af Amer >60 >60 mL/min   GFR calc Af Amer >60 >60 mL/min   Anion gap 9 5 - 15  CBC     Status: Abnormal   Collection Time: 01/22/18 12:42 PM  Result Value Ref Range   WBC 5.9 3.6 - 11.0 K/uL   RBC 4.52 3.80 - 5.20 MIL/uL   Hemoglobin 12.7 12.0 - 16.0 g/dL   HCT 81.138.7 91.435.0 - 78.247.0 %   MCV 85.6 80.0 - 100.0 fL   MCH 28.0 26.0 - 34.0 pg   MCHC 32.7 32.0 - 36.0 g/dL   RDW 95.615.1 (H) 21.311.5 - 08.614.5 %   Platelets 189 150 - 440 K/uL  Pregnancy, urine POC     Status: None   Collection Time: 01/22/18 12:48 PM  Result Value Ref Range   Preg Test, Ur NEGATIVE NEGATIVE   ____________________________________________  EKG My review and personal interpretation at Time: 11:11   Indication: duizziness  Rate: 70  Rhythm: sinus Axis: normal  Other: normal intervals, no stemi ____________________________________________  RADIOLOGY  I personally  reviewed all radiographic images ordered to evaluate for the above acute complaints and reviewed radiology reports and findings.  These findings were personally discussed with the patient.  Please see medical record for radiology report.  ____________________________________________   PROCEDURES  Procedure(s) performed:  Procedures    Critical Care performed: no ____________________________________________   INITIAL IMPRESSION / ASSESSMENT AND PLAN / ED COURSE  Pertinent labs & imaging results that were available during my care of the patient were reviewed by me and considered in my medical decision making (see chart for details).   DDX: Dehydration, anemia, lecture light abnormality, infectious process, medication effect, UTI, BV, trichomoniasis, candidiasis  Brandy Luna is a 32 y.o. who presents to the ED with symptoms as described above.  Nontoxic-appearing afebrile Heema dynamically stable.  Neuro exam is nonfocal.  Blood work sent for the above differential was reassuring.  No evidence of dysrhythmia.  Not clinically consistent with ACS.  No evidence of PID.  Will treat for trichomonas.  Patient stable and appropriate for outpatient follow-up.     As part of my medical decision making, I reviewed the following data within the electronic MEDICAL RECORD NUMBER Nursing notes reviewed and incorporated, Labs reviewed, notes from prior ED visits.   ____________________________________________   FINAL CLINICAL IMPRESSION(S) / ED DIAGNOSES  Final diagnoses:  Itching  Trichomoniasis      NEW MEDICATIONS STARTED DURING THIS VISIT:  New Prescriptions   METRONIDAZOLE (FLAGYL) 500 MG TABLET    Take 1 tablet (500 mg total) by mouth 2 (two) times daily for 7 days.   ONDANSETRON (ZOFRAN) 4 MG TABLET    Take 1 tablet (4 mg total) by mouth daily as needed for nausea or vomiting.     Note:  This document was prepared using Dragon voice recognition software and may include  unintentional dictation errors.    Willy Eddyobinson, Keymora Grillot, MD 01/22/18 [phone]

## 2018-01-22 NOTE — ED Notes (Signed)
First Nurse Note:  Patient started "anti-inflammatory" on Friday, now here complaining of itching and "bumps".  Vaginal itching.  Denies shortness of breath.  NAD.

## 2018-04-07 ENCOUNTER — Other Ambulatory Visit: Payer: Self-pay

## 2018-04-07 ENCOUNTER — Emergency Department: Payer: Medicaid Other

## 2018-04-07 ENCOUNTER — Encounter: Payer: Self-pay | Admitting: Emergency Medicine

## 2018-04-07 ENCOUNTER — Emergency Department
Admission: EM | Admit: 2018-04-07 | Discharge: 2018-04-07 | Disposition: A | Discharge status: Home / Self Care | Payer: Medicaid Other | Attending: Emergency Medicine | Admitting: Emergency Medicine

## 2018-04-07 DIAGNOSIS — Z91040 Latex allergy status: Secondary | ICD-10-CM | POA: Insufficient documentation

## 2018-04-07 DIAGNOSIS — J111 Influenza due to unidentified influenza virus with other respiratory manifestations: Secondary | ICD-10-CM | POA: Insufficient documentation

## 2018-04-07 DIAGNOSIS — Z79899 Other long term (current) drug therapy: Secondary | ICD-10-CM | POA: Insufficient documentation

## 2018-04-07 DIAGNOSIS — F17210 Nicotine dependence, cigarettes, uncomplicated: Secondary | ICD-10-CM | POA: Insufficient documentation

## 2018-04-07 LAB — INFLUENZA PANEL BY PCR (TYPE A & B)
INFLBPCR: POSITIVE — AB
Influenza A By PCR: NEGATIVE

## 2018-04-07 MED ORDER — OSELTAMIVIR PHOSPHATE 75 MG PO CAPS: 75.0000 mg | ORAL_CAPSULE | Freq: Two times a day (BID) | ORAL | 0 refills | Status: AC

## 2018-04-07 MED ORDER — IBUPROFEN 600 MG PO TABS: 600.0000 mg | ORAL_TABLET | Freq: Three times a day (TID) | ORAL | 0 refills | Status: AC | PRN

## 2018-04-07 MED ORDER — PSEUDOEPH-BROMPHEN-DM 30-2-10 MG/5ML PO SYRP: 5.0000 mL | ORAL_SOLUTION | Freq: Four times a day (QID) | ORAL | 0 refills | Status: AC | PRN

## 2018-04-07 NOTE — ED Provider Notes (Signed)
[facility] Emergency Department Provider Note   ____________________________________________   First MD Initiated Contact with Patient 04/07/18 0730     (approximate)  I have reviewed the triage vital signs and the nursing notes.   HISTORY  Chief Complaint Generalized Body Aches    HPI Brandy Luna is a 32 y.o. female patient complain of body aches, fever/chills, cough, nasal and chest congestion with nausea and vomiting.  Patient denies diarrhea.  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  Patient denied take the flu shot last year.  Past Medical History:  Diagnosis Date  . Anemia   . Anxiety   . Depression   . Pneumonia     Patient Active Problem List   Diagnosis Date Noted  . Trichomonas vaginalis (TV) infection 11/27/2016  . Right lower quadrant pain 10/11/2016  . Preterm premature rupture of membranes (PPROM) with onset of labor after 24 hours of rupture in first trimester, antepartum 10/10/2016  . [redacted] weeks gestation of pregnancy 10/10/2016  . Labor and delivery indication for care or intervention 10/09/2016  . High risk pregnancy due to assisted reproductive technology, antepartum 10/02/2016  . History of cesarean delivery 10/02/2016  . History of substance abuse 10/02/2016  . Vaginal discharge 10/02/2016  . Yeast vaginitis 10/02/2016  . Indication for care in labor or delivery 08/07/2016  . Encounter for antenatal screening 05/03/2016    Past Surgical History:  Procedure Laterality Date  . CESAREAN SECTION    . CESAREAN SECTION N/A 10/10/2016   Procedure: CESAREAN SECTION;  Surgeon: Vena Austria, MD;  Location: [facility] ORS;  Service: Obstetrics;  Laterality: N/A;  . CESAREAN SECTION    . TONSILLECTOMY      Prior to Admission medications   Medication Sig Start Date End Date Taking? Authorizing Provider  brompheniramine-pseudoephedrine-DM 30-2-10 MG/5ML syrup Take 5 mLs by mouth 4 (four)  times daily as needed. 04/07/18   Joni Reining, PA-C  chlorpheniramine-HYDROcodone (TUSSIONEX PENNKINETIC ER) 10-8 MG/5ML SUER Take 5 mLs by mouth 2 (two) times daily. Patient not taking: Reported on 01/22/2018 10/09/17   Irean Hong, MD  cyclobenzaprine (FLEXERIL) 10 MG tablet Take 1/2 to one tablet q8 hours prn Patient not taking: Reported on 01/22/2018 08/07/16   Farrel Conners, CNM  ibuprofen (ADVIL,MOTRIN) 600 MG tablet Take 1 tablet (600 mg total) by mouth every 6 (six) hours. Patient not taking: Reported on 01/22/2018 10/14/16   Conard Novak, MD  ibuprofen (ADVIL,MOTRIN) 600 MG tablet Take 1 tablet (600 mg total) by mouth every 8 (eight) hours as needed. 04/07/18   Joni Reining, PA-C  meloxicam (MOBIC) 15 MG tablet Take 1 tablet (15 mg total) by mouth daily. 01/17/18   Cuthriell, Delorise Royals, PA-C  ondansetron (ZOFRAN ODT) 4 MG disintegrating tablet Take 1 tablet (4 mg total) by mouth every 8 (eight) hours as needed for nausea or vomiting. Patient not taking: Reported on 01/22/2018 03/17/17   Little, Traci M, PA-C  ondansetron (ZOFRAN) 4 MG tablet Take 1 tablet (4 mg total) by mouth daily as needed for nausea or vomiting. 01/22/18 01/22/19  Willy Eddy, MD  oseltamivir (TAMIFLU) 75 MG capsule Take 1 capsule (75 mg total) by mouth 2 (two) times daily for 5 days. 04/07/18 04/12/18  Joni Reining, PA-C  oxyCODONE-acetaminophen (PERCOCET/ROXICET) 5-325 MG tablet Take 2 tablets by mouth every 4 (four) hours as needed (breakthrough pain). Patient not taking: Reported on 01/22/2018 10/14/16   Thomasene Mohair  D, MD    Allergies Latex and Zithromax [azithromycin]  Family History  Problem Relation Age of Onset  . Diabetes Mother   . Hypertension Mother   . Diabetes Maternal Grandmother   . Stroke Maternal Grandmother   . Diabetes Maternal Grandfather   . Stroke Maternal Grandfather     Social History Social History   Tobacco Use  . Smoking status: Current Every Day Smoker     Packs/day: 0.50    Types: Cigarettes  . Smokeless tobacco: Never Used  Substance Use Topics  . Alcohol use: No  . Drug use: Yes    Types: Marijuana    Review of Systems Constitutional: Fever/chills and body aches. Eyes: No visual changes. ENT: Nasal congestion sore throat.  Cardiovascular: Denies chest pain. Respiratory: Productive cough. Gastrointestinal: No abdominal pain.  Nausea and vomiting.  No diarrhea.  No constipation. Genitourinary: Negative for dysuria. Musculoskeletal: Negative for back pain. Skin: Negative for rash. Neurological: Negative for headaches, focal weakness or numbness. Psychiatric: Anxiety and depression. Hematological/Lymphatic:Anemia. Allergic/Immunilogical: Latex and Zithromax.  ____________________________________________   PHYSICAL EXAM:  VITAL SIGNS: ED Triage Vitals  Enc Vitals Group     BP 04/07/18 0726 123/72     Pulse Rate 04/07/18 0726 89     Resp 04/07/18 0726 20     Temp 04/07/18 0726 98.3 F (36.8 C)     Temp Source 04/07/18 0726 Oral     SpO2 04/07/18 0726 100 %     Weight 04/07/18 0724 180 lb (81.6 kg)     Height 04/07/18 0724 5\' 4"  (1.626 m)     Head Circumference --      Peak Flow --      Pain Score 04/07/18 0724 10     Pain Loc --      Pain Edu? --      Excl. in GC? --     Constitutional: Alert and oriented. Well appearing and in no acute distress. Eyes: Conjunctivae are normal. PERRL. EOMI. Head: Atraumatic. Nose: Edematous nasal turbinates clear rhinorrhea Mouth/Throat: Mucous membranes are moist.  Oropharynx non-erythematous.  Postnasal drainage. Neck: No stridor.  Hematological/Lymphatic/Immunilogical: No cervical lymphadenopathy. Cardiovascular: Normal rate, regular rhythm. Grossly normal heart sounds.  Good peripheral circulation. Respiratory: Normal respiratory effort.  No retractions. Lungs CTAB. Gastrointestinal: Soft and nontender. No distention. No abdominal bruits. No CVA tenderness. Genitourinary:  Deferred Musculoskeletal: No lower extremity tenderness nor edema.  No joint effusions. Neurologic:  Normal speech and language. No gross focal neurologic deficits are appreciated. No gait instability. Skin:  Skin is warm, dry and intact. No rash noted. Psychiatric: Mood and affect are normal. Speech and behavior are normal.  ____________________________________________   LABS (all labs ordered are listed, but only abnormal results are displayed)  Labs Reviewed  INFLUENZA PANEL BY PCR (TYPE A & B) - Abnormal; Notable for the following components:      Result Value   Influenza B By PCR POSITIVE (*)    All other components within normal limits   ____________________________________________  EKG   ____________________________________________  RADIOLOGY  ED MD interpretation:    Official radiology report(s): Dg Chest 2 View  Result Date: 04/07/2018 CLINICAL DATA:  Flu like symptoms including cough, chest congestion, fever, and body aches for the past 3 days. Current smoker. EXAM: CHEST - 2 VIEW COMPARISON:  Portable chest x-ray of January 22, 2018 FINDINGS: The lungs are adequately inflated and clear. The heart and pulmonary vascularity are normal. The mediastinum is normal in width. There is  no pleural effusion. The bony thorax is unremarkable. IMPRESSION: There is no pneumonia nor other acute cardiopulmonary abnormality. Electronically Signed   By: David  [place] M.D.   On: 04/07/2018 08:09    ____________________________________________   PROCEDURES  Procedure(s) performed: None  Procedures  Critical Care performed: No  ____________________________________________   INITIAL IMPRESSION / ASSESSMENT AND PLAN / ED COURSE  As part of my medical decision making, I reviewed the following data within the electronic MEDICAL RECORD NUMBER    Viral respiratory infection secondary to influenza.  Discussed labs and chest x-ray results with patient.  Patient given discharge care  instructions and a work note.  Patient advised take medication as directed and follow-up with PCP.      ____________________________________________   FINAL CLINICAL IMPRESSION(S) / ED DIAGNOSES  Final diagnoses:  Influenza     ED Discharge Orders         Ordered    oseltamivir (TAMIFLU) 75 MG capsule  2 times daily     04/07/18 0822    brompheniramine-pseudoephedrine-DM 30-2-10 MG/5ML syrup  4 times daily PRN     04/07/18 0822    ibuprofen (ADVIL,MOTRIN) 600 MG tablet  Every 8 hours PRN     04/07/18 2130           Note:  This document was prepared using Dragon voice recognition software and may include unintentional dictation errors.    Joni Reining, PA-C 04/07/18 8657    Sharman Cheek, MD 04/08/18 [phone]

## 2018-04-07 NOTE — ED Triage Notes (Signed)
Pt thinks may have flu.  Started feeling bad Friday and has had cough, congestion, fever, and generalized body aches.  Pt coughing in triage.  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

## 2018-04-07 NOTE — ED Notes (Signed)
See triage note  States she developed body aches and cough over the past couple of days  Unsure of fever   Afebrile on arrival

## 2018-10-15 IMAGING — US US MFM OB DETAIL+14 WK
1 series · 12 of 28 positions shown · non-contrast
Comparison: none

PATIENT INFO:

PERFORMED BY:
LOCKLE[facility] PA
SERVICE(S) PROVIDED:
INDICATIONS:
18 weeks gestation of pregnancy
Anatomy
FETAL EVALUATION:
Num Of Fetuses:     1
Fetal Heart         140
Rate(bpm):
Cardiac Activity:   Present
Presentation:       Variable
Placenta:           Anterior, No previa
P. Cord Insertion:  Normal
Amniotic Fluid
AFI FV:      Normal
BIOMETRY:
BPD:        40  mm     G. Age:  18w 1d         57  %    CI:        74.14   %    70 - 86
FL/HC:       17.0  %    15.8 - 18
HC:      147.5  mm     G. Age:  17w 6d         37  %
FL/BPD:      62.8  %
FL:       25.1  mm     G. Age:  17w 4d         30  %
HUM:      25.9  mm     G. Age:  18w 1d         60  %
GESTATIONAL AGE:
U/S Today:     17w 6d                                        EDD:   11/16/16
Best:          18w 0d     Det. By:  U/S C R L  (05/03/16)    EDD:   11/15/16
ANATOMY:
Cavum:                 CSP visualized         Ductal Arch:            Normal appearance
Ventricles:            Normal appearance      Diaphragm:              Within Normal Limits
Cerebellum:            Within Normal Limits   Stomach:                Seen
Posterior Fossa:       Within Normal Limits   Abdomen:                Within Normal
Limits
Nuchal Fold:           Within Normal Limits   Abdominal Wall:         Normal appearance
Face:                  Orbits visualized      Cord Vessels:           3 vessels
Lips:                  Normal appearance      Kidneys:                Normal appearance
Thoracic:              Within Normal Limits   Bladder:                Seen
Heart:                 4-Chamber view         Spine:                  Normal appearance
appears normal
RVOT:                  Normal appearance      Upper Extremities:      Visualized
LVOT:                  Normal appearance      Lower Extremities:      Visualized
Aortic Arch:           Normal appearance
Other:  Ao/PA normal appearance

[Series 1: us mfm ob detail+14 wk · 0.17mm/px · 12 of 61 slices shown]
[im 3/61]
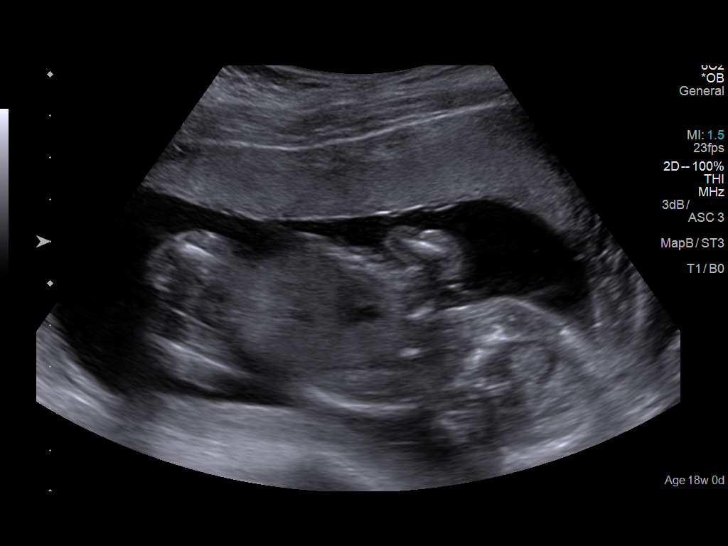
[im 7/61]
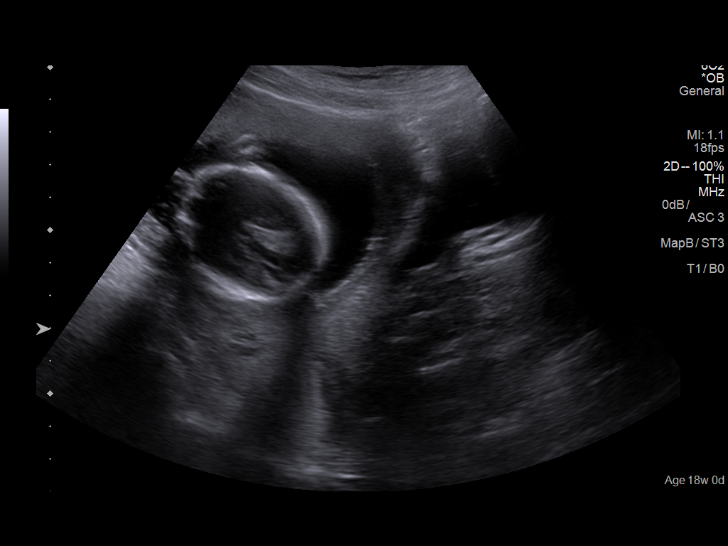
[im 12/61]
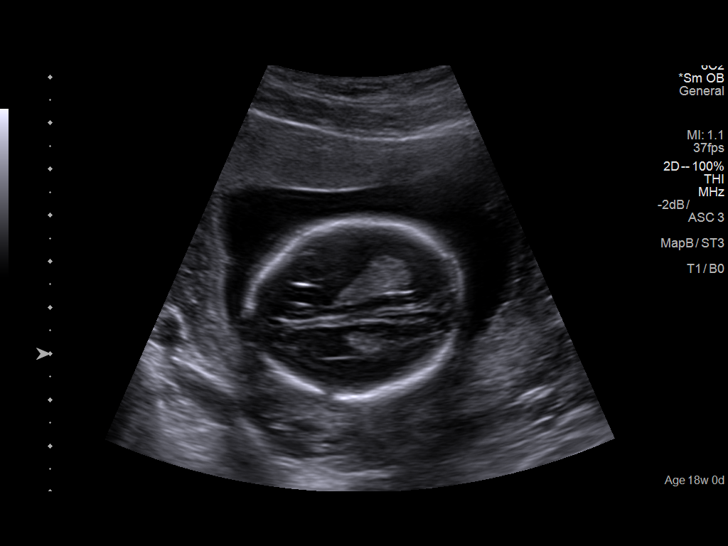
[im 18/61]
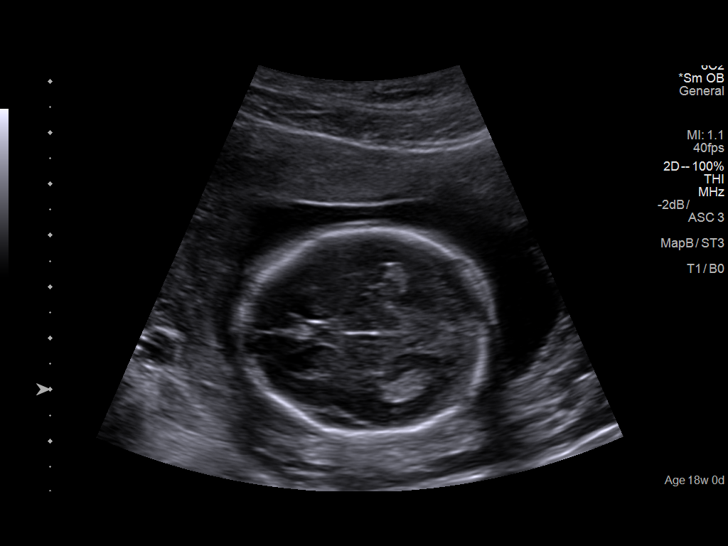
[im 23/61]
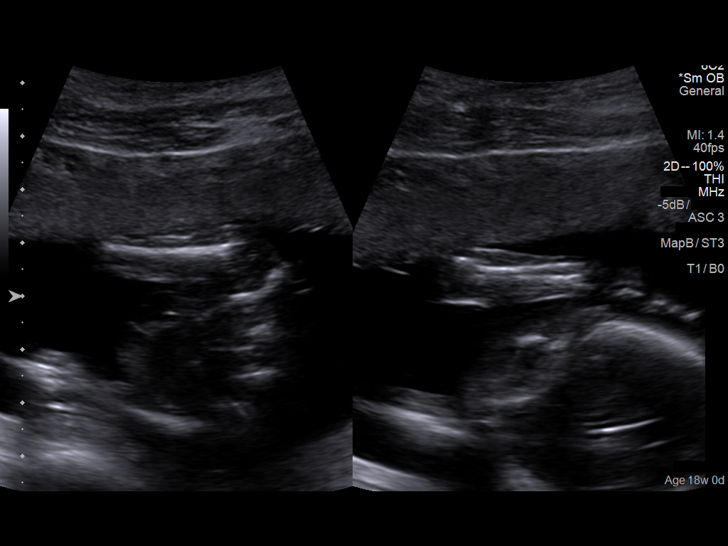
[im 27/61]
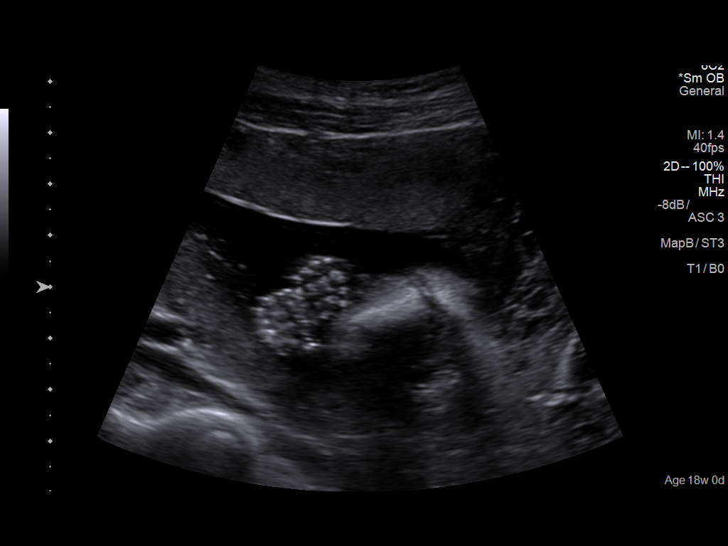
[im 34/61]
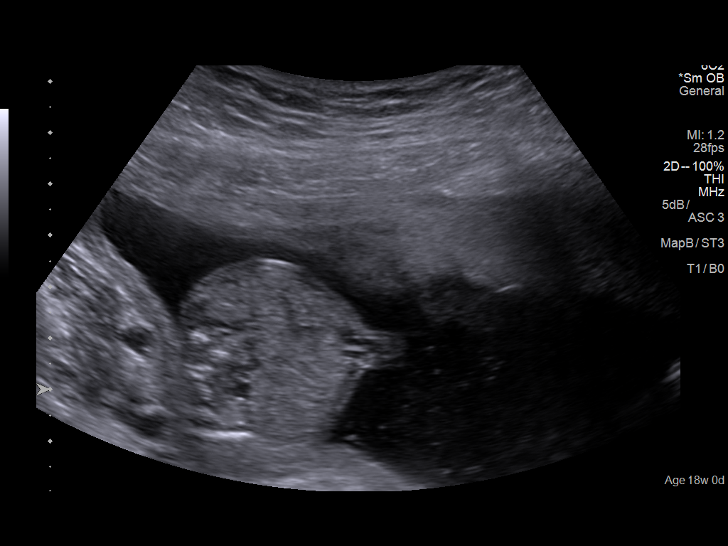
[im 38/61]
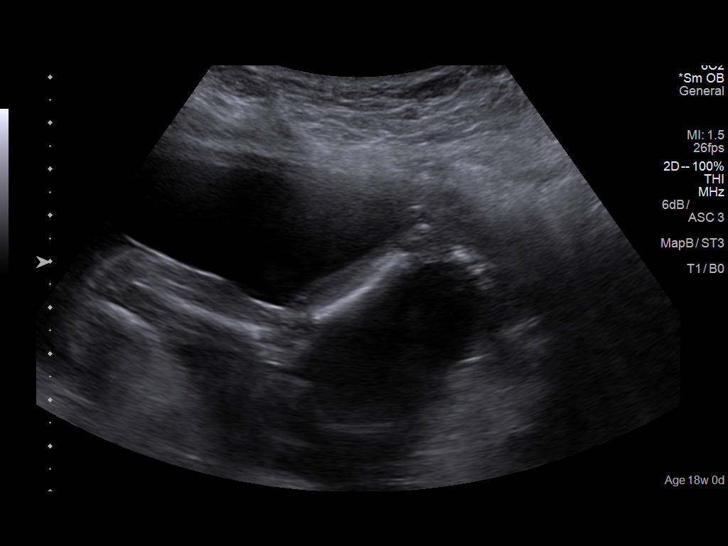
[im 43/61]
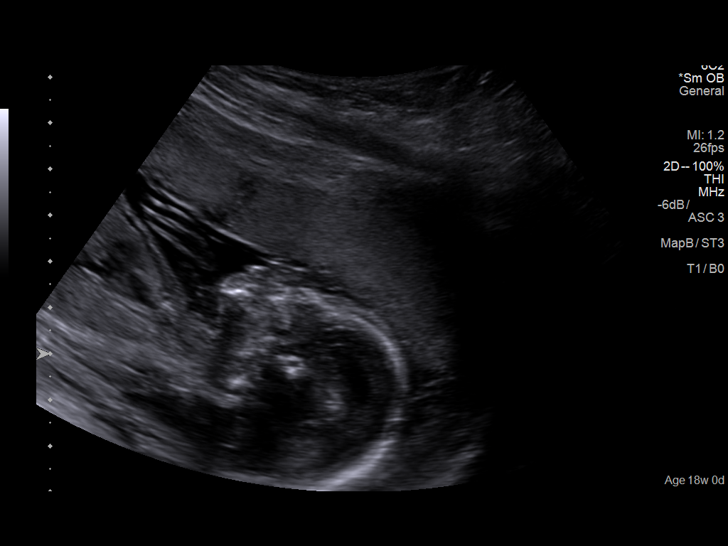
[im 49/61]
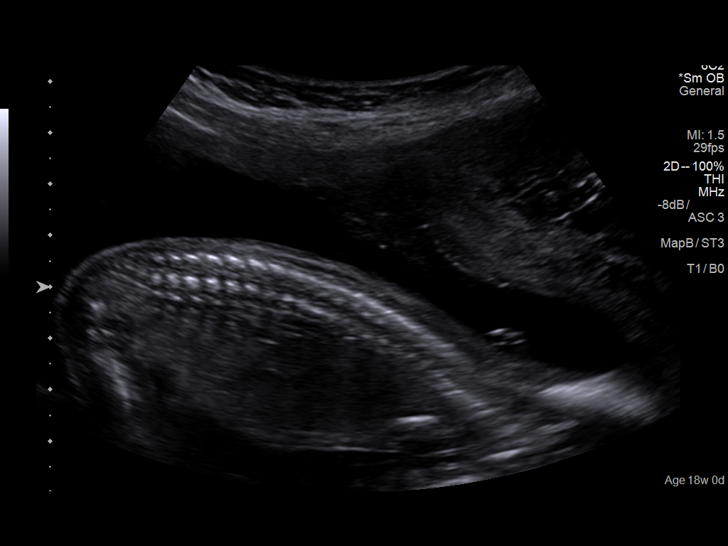
[im 54/61]
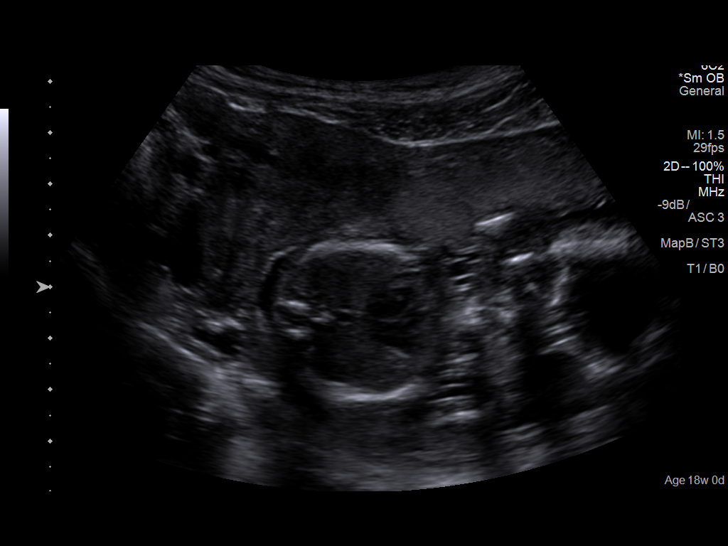
[im 58/61]
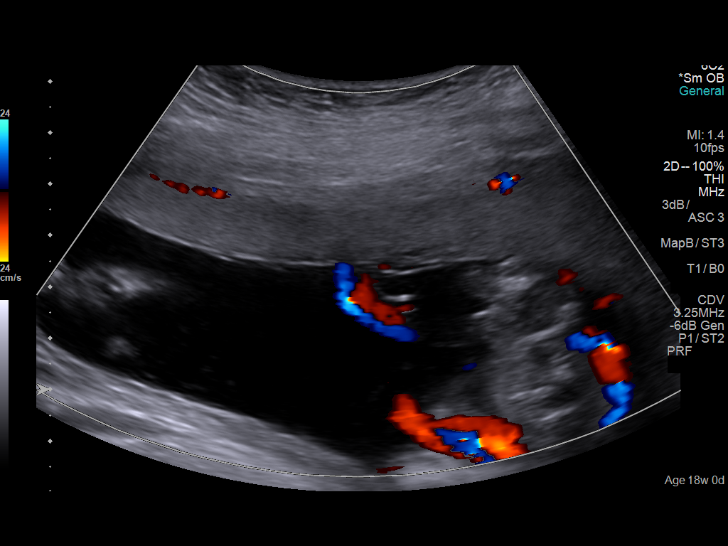

[12 of 28 positions shown; findings below may reference images not displayed]

IMPRESSION: Single intrauterine pregnancy with a best estimated
gestational age of 18 weeks 0 days.  Dating is based on
earliest available ultrasound, performed at [facility]
[HOS[facility]], on 05/03/2016; measurements were reported as 12
weeks 0 days.

Detailed ultrasound performed for the indication of an
echogenic intracardiac focus.

And echogenic intracardiac focus was identifed.  Other fetal
anatomy visualized appears normal.

Findings were discussed.  Echogenic intracardiac focus (EIF)
is a relatively common midtrimester finding and most often
represents a normal variant.  Presence of EIF does
somewhat increase risk for a fetal chromosomal abnormality
above baseline age but if isolated and the patient has already
had normal aneuploidy screening, it should be considered a
normal variant.  EIF does not in and of itself pose any known
harm to the fetus and the natural history is resolution of the
EIF during presengny or early childhood.  No further imaging
follow up of the EIF itself is indicated.

Ms. Magda Alexandra had first trimester screening with results in the
normal range; consider msAFP only.

## 2019-09-28 DIAGNOSIS — F4323 Adjustment disorder with mixed anxiety and depressed mood: Secondary | ICD-10-CM

## 2019-09-28 DIAGNOSIS — R87619 Unspecified abnormal cytological findings in specimens from cervix uteri: Secondary | ICD-10-CM

## 2019-09-29 ENCOUNTER — Ambulatory Visit: Payer: Self-pay

## 2020-05-07 IMAGING — CR DG CHEST 2V
1 series · 2 of 2 positions shown · non-contrast
Comparison: Portable chest x-ray January 22, 2018

CLINICAL DATA: Flu like symptoms including cough, chest congestion,
fever, and body aches for the past 3 days. Current smoker.

EXAM:
CHEST - 2 VIEW

[Series 1: dg chest 2 view · 0.14mm/px · 2 of 2 slices shown]
[im 1/2]
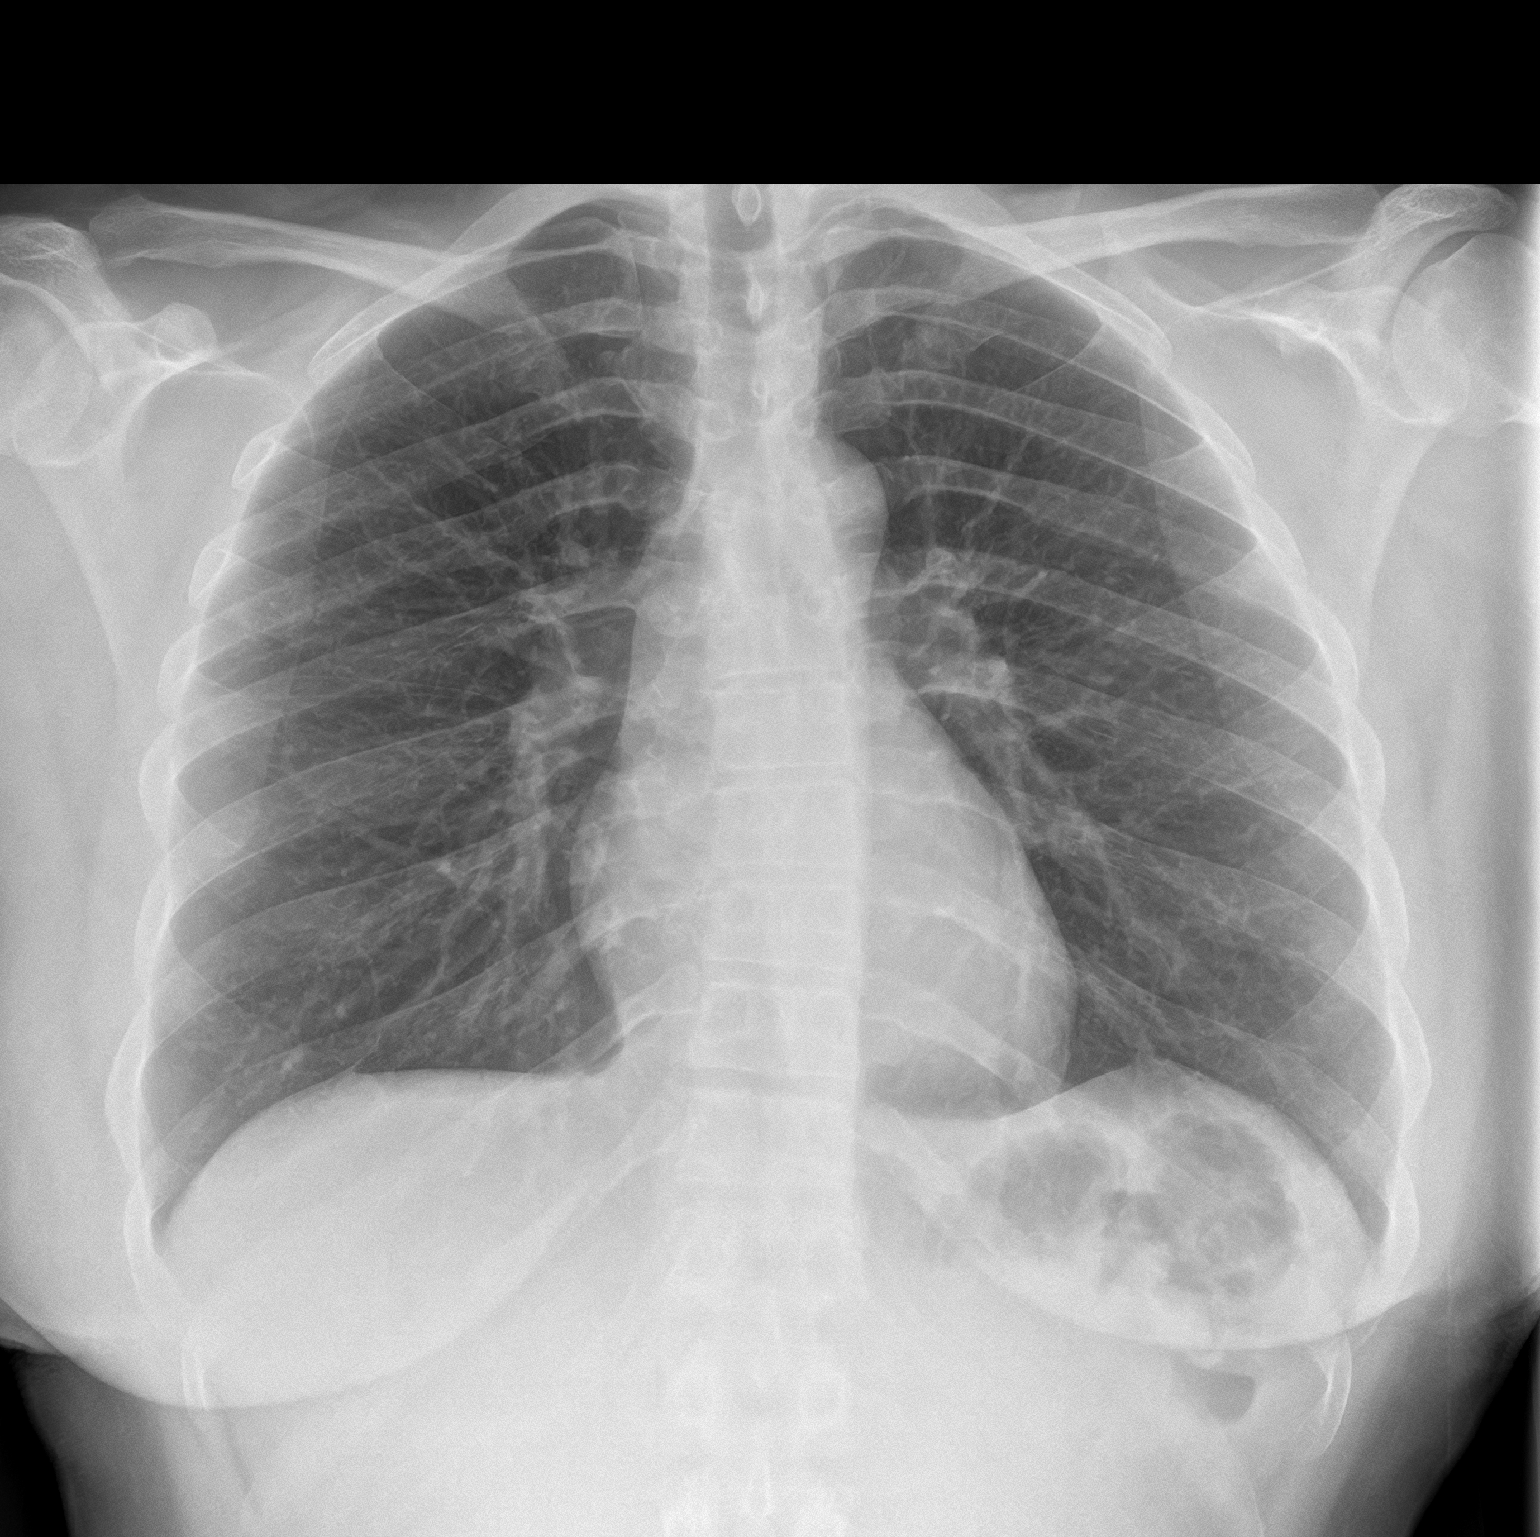
[im 2/2]
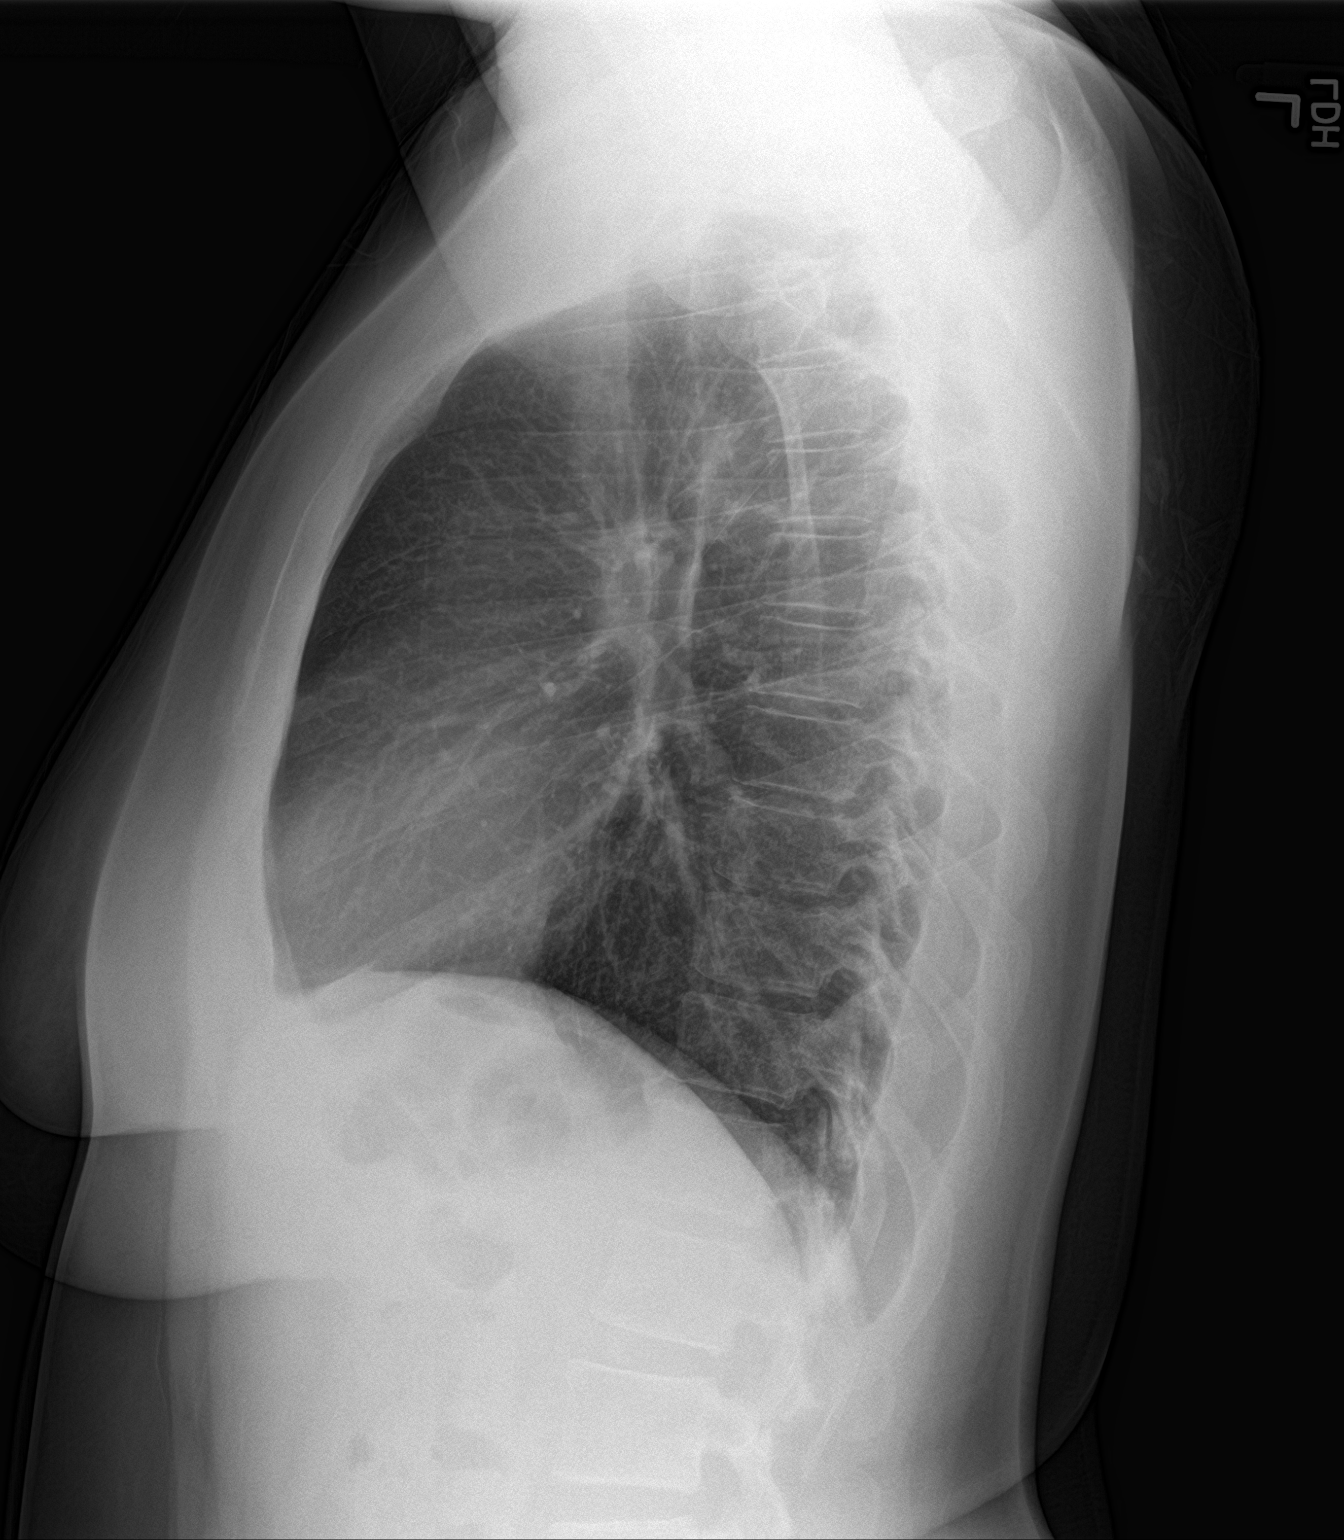

[2 of 2 positions shown; findings below may reference images not displayed]

FINDINGS: The lungs are adequately inflated and clear. The heart and pulmonary
vascularity are normal. The mediastinum is normal in width. There is
no pleural effusion. The bony thorax is unremarkable.
IMPRESSION: There is no pneumonia nor other acute cardiopulmonary abnormality.

## 2022-07-13 ENCOUNTER — Emergency Department
Admission: EM | Admit: 2022-07-13 | Discharge: 2022-07-13 | Disposition: A | Discharge status: Home / Self Care | Payer: Medicaid Other | Attending: Emergency Medicine | Admitting: Emergency Medicine

## 2022-07-13 ENCOUNTER — Other Ambulatory Visit: Payer: Self-pay

## 2022-07-13 DIAGNOSIS — N72 Inflammatory disease of cervix uteri: Secondary | ICD-10-CM | POA: Diagnosis not present

## 2022-07-13 DIAGNOSIS — R102 Pelvic and perineal pain: Secondary | ICD-10-CM | POA: Diagnosis present

## 2022-07-13 LAB — WET PREP, GENITAL
Clue Cells Wet Prep HPF POC: NONE SEEN
Sperm: NONE SEEN
Trich, Wet Prep: NONE SEEN
WBC, Wet Prep HPF POC: <10 (ref <10)
Yeast Wet Prep HPF POC: NONE SEEN

## 2022-07-13 LAB — CHLAMYDIA/NGC RT PCR (ARMC ONLY)
Chlamydia Tr: NOT DETECTED
N gonorrhoeae: NOT DETECTED

## 2022-07-13 LAB — BASIC METABOLIC PANEL
Anion gap: 6 (ref 5–15)
BUN: 6 mg/dL (ref 6–20)
CO2: 24 mmol/L (ref 22–32)
Calcium: 8.8 mg/dL — ABNORMAL LOW (ref 8.9–10.3)
Chloride: 107 mmol/L (ref 98–111)
Creatinine, Ser: 0.61 mg/dL (ref 0.44–1.00)
GFR, Estimated: >60 mL/min (ref >60)
Glucose, Bld: 94 mg/dL (ref 70–99)
Potassium: 3.4 mmol/L — ABNORMAL LOW (ref 3.5–5.1)
Sodium: 137 mmol/L (ref 135–145)

## 2022-07-13 LAB — CBC
HCT: 39.8 % (ref 36.0–46.0)
Hemoglobin: 12.8 g/dL (ref 12.0–15.0)
MCH: 27.8 pg (ref 26.0–34.0)
MCHC: 32.2 g/dL (ref 30.0–36.0)
MCV: 86.3 fL (ref 80.0–100.0)
Platelets: 248 10*3/uL (ref 150–400)
RBC: 4.61 MIL/uL (ref 3.87–5.11)
RDW: 13.4 % (ref 11.5–15.5)
WBC: 5.9 10*3/uL (ref 4.0–10.5)
nRBC: 0 % (ref 0.0–0.2)

## 2022-07-13 LAB — LIPASE, BLOOD: Lipase: 32 U/L (ref 11–51)

## 2022-07-13 LAB — HCG, QUANTITATIVE, PREGNANCY: hCG, Beta Chain, Quant, S: <1 m[IU]/mL (ref <5)

## 2022-07-13 MED ORDER — KETOROLAC TROMETHAMINE 30 MG/ML IJ SOLN
30.0000 mg | Freq: Once | INTRAMUSCULAR | Status: AC
  Administered 2022-07-13: 30 mg via INTRAMUSCULAR
  Filled 2022-07-13: qty 1

## 2022-07-13 NOTE — ED Triage Notes (Signed)
Pt to ED for missed period. LMP 11/10. States now have LLQ pain that started yesterday. Took pregnancy test +result. NAD noted.

## 2022-07-13 NOTE — ED Provider Notes (Signed)
   [facility] Provider Note    Event Date/Time   First MD Initiated Contact with Patient 07/13/22 1136     (approximate)   History   Abdominal Pain   HPI  Brandy Luna is a 36 y.o. female presents with complaints of left lower quadrant/pelvic pain.  Patient reports took a pregnancy test at home which was positive, she reports her menstrual cycle is late which is atypical for her.  Also reports vaginal discharge.  No bleeding.     Physical Exam   Triage Vital Signs: ED Triage Vitals [07/13/22 1117]  Enc Vitals Group     BP (!) 133/96     Pulse Rate 85     Resp 16     Temp 98.6 F (37 C)     Temp src      SpO2 100 %     Weight 68.9 kg (152 lb)     Height 1.626 m (5\' 4" )     Head Circumference      Peak Flow      Pain Score 8     Pain Loc      Pain Edu?      Excl. in GC?     Most recent vital signs: Vitals:   07/13/22 1117  BP: (!) 133/96  Pulse: 85  Resp: 16  Temp: 98.6 F (37 C)  SpO2: 100%     General: Awake, no distress.  CV:  Good peripheral perfusion.  Resp:  Normal effort.  Abd:  No distention.  Other:  Pelvic exam: Mild cervicitis, no CMT   ED Results / Procedures / Treatments   Labs (all labs ordered are listed, but only abnormal results are displayed) Labs Reviewed  BASIC METABOLIC PANEL - Abnormal; Notable for the following components:      Result Value   Potassium 3.4 (*)    Calcium 8.8 (*)    All other components within normal limits  WET PREP, GENITAL  CHLAMYDIA/NGC RT PCR ([facility] ONLY)            CBC  LIPASE, BLOOD  HCG, QUANTITATIVE, PREGNANCY     EKG     RADIOLOGY     PROCEDURES:  Critical Care performed:   Procedures   MEDICATIONS ORDERED IN ED: Medications  ketorolac (TORADOL) 30 MG/ML injection 30 mg (30 mg Intramuscular Given 07/13/22 1305)     IMPRESSION / MDM / ASSESSMENT AND PLAN / ED COURSE  I reviewed the triage vital signs and the nursing notes. Patient's presentation  is most consistent with acute presentation with potential threat to life or bodily function.  Patient presents with left lower quadrant pain in the setting of positive pregnancy test.  Differential includes ectopic pregnancy, IUP, diverticulitis, cervicitis.  Beta-hCG is negative, patient is not pregnant.  Not consistent with ectopic pregnancy or IUP.  No tenderness palpation on exam.  Question cervicitis.  GU exam performed  Patient left before wet prep results had returned because her child fell at school, workup was not complete     FINAL CLINICAL IMPRESSION(S) / ED DIAGNOSES   Final diagnoses:  Pelvic pain     Rx / DC Orders   ED Discharge Orders     None        Note:  This document was prepared using Dragon voice recognition software and may include unintentional dictation errors.   07/15/22, MD 07/13/22 [phone]

## 2022-08-15 ENCOUNTER — Encounter: Payer: Self-pay | Admitting: Emergency Medicine

## 2022-08-15 ENCOUNTER — Emergency Department
Admission: EM | Admit: 2022-08-15 | Discharge: 2022-08-15 | Disposition: A | Discharge status: Home / Self Care | Payer: Medicaid Other | Attending: Emergency Medicine | Admitting: Emergency Medicine

## 2022-08-15 ENCOUNTER — Other Ambulatory Visit: Payer: Self-pay

## 2022-08-15 ENCOUNTER — Emergency Department: Payer: Medicaid Other

## 2022-08-15 DIAGNOSIS — A599 Trichomoniasis, unspecified: Secondary | ICD-10-CM | POA: Insufficient documentation

## 2022-08-15 DIAGNOSIS — R102 Pelvic and perineal pain: Secondary | ICD-10-CM | POA: Diagnosis present

## 2022-08-15 LAB — CBC
HCT: 39.1 % (ref 36.0–46.0)
Hemoglobin: 12.4 g/dL (ref 12.0–15.0)
MCH: 27.4 pg (ref 26.0–34.0)
MCHC: 31.7 g/dL (ref 30.0–36.0)
MCV: 86.3 fL (ref 80.0–100.0)
Platelets: 200 10*3/uL (ref 150–400)
RBC: 4.53 MIL/uL (ref 3.87–5.11)
RDW: 13.8 % (ref 11.5–15.5)
WBC: 5.7 10*3/uL (ref 4.0–10.5)
nRBC: 0 % (ref 0.0–0.2)

## 2022-08-15 LAB — CHLAMYDIA/NGC RT PCR (ARMC ONLY)
Chlamydia Tr: NOT DETECTED
N gonorrhoeae: NOT DETECTED

## 2022-08-15 LAB — URINALYSIS, ROUTINE W REFLEX MICROSCOPIC
Bacteria, UA: NONE SEEN
Bilirubin Urine: NEGATIVE
Glucose, UA: NEGATIVE mg/dL
Ketones, ur: NEGATIVE mg/dL
Leukocytes,Ua: NEGATIVE
Nitrite: NEGATIVE
Protein, ur: NEGATIVE mg/dL
RBC / HPF: >50 RBC/hpf — ABNORMAL HIGH (ref 0–5)
Specific Gravity, Urine: 1.019 (ref 1.005–1.030)
pH: 8 (ref 5.0–8.0)

## 2022-08-15 LAB — WET PREP, GENITAL
Sperm: NONE SEEN
WBC, Wet Prep HPF POC: <10 (ref <10)
Yeast Wet Prep HPF POC: NONE SEEN

## 2022-08-15 LAB — POC URINE PREG, ED: Preg Test, Ur: NEGATIVE

## 2022-08-15 LAB — HCG, QUANTITATIVE, PREGNANCY: hCG, Beta Chain, Quant, S: <1 m[IU]/mL (ref <5)

## 2022-08-15 MED ORDER — METRONIDAZOLE 500 MG PO TABS: 500.0000 mg | ORAL_TABLET | Freq: Two times a day (BID) | ORAL | 0 refills | Status: AC

## 2022-08-15 NOTE — ED Provider Notes (Signed)
[facility] Provider Note    Event Date/Time   First MD Initiated Contact with Patient 08/15/22 1141     (approximate)   History   Abdominal Pain   HPI  Brandy Luna is a 37 y.o. female   Past medical history of anemia, anxiety and depression who presents to the emergency department with pelvic pain, vaginal discomfort over the last several days.  She had a home pregnancy test that was +2 weeks ago but had not establish care with gynecologist or obstetrics provider yet.  Denies vaginal discharge but does have a history of trichomoniasis.   External Medical Documents Reviewed: Emergency department visit dated December 2023 for similar pelvic pain      Physical Exam   Triage Vital Signs: ED Triage Vitals  Enc Vitals Group     BP 08/15/22 1054 121/82     Pulse Rate 08/15/22 1054 77     Resp 08/15/22 1054 16     Temp 08/15/22 1054 98 F (36.7 C)     Temp src --      SpO2 08/15/22 1054 100 %     Weight 08/15/22 1055 164 lb (74.4 kg)     Height 08/15/22 1055 5\' 4"  (1.626 m)     Head Circumference --      Peak Flow --      Pain Score 08/15/22 1040 6     Pain Loc --      Pain Edu? --      Excl. in [place]? --     Most recent vital signs: Vitals:   08/15/22 1054  BP: 121/82  Pulse: 77  Resp: 16  Temp: 98 F (36.7 C)  SpO2: 100%    General: Awake, no distress.  CV:  Good peripheral perfusion.  Resp:  Normal effort.  Abd:  No distention.  Other:  Very mild suprapubic tenderness to palpation no rigidity or guarding.  Deferred pelvic exam given hallway patient and patient preference to leave the hospital to pick up her son, see MDM below   ED Results / Procedures / Treatments   Labs (all labs ordered are listed, but only abnormal results are displayed) Labs Reviewed  WET PREP, GENITAL - Abnormal; Notable for the following components:      Result Value   Trich, Wet Prep PRESENT (*)    Clue Cells Wet Prep HPF POC PRESENT (*)    All  other components within normal limits  URINALYSIS, ROUTINE W REFLEX MICROSCOPIC - Abnormal; Notable for the following components:   Color, Urine YELLOW (*)    APPearance HAZY (*)    Hgb urine dipstick MODERATE (*)    RBC / HPF >50 (*)    All other components within normal limits  CHLAMYDIA/NGC RT PCR ([facility] ONLY)            CBC  HCG, QUANTITATIVE, PREGNANCY  POC URINE PREG, ED     I ordered and reviewed the above labs they are notable for positive for trichomoniasis.  Negative pregnancy test.    PROCEDURES:  Critical Care performed: No  Procedures   MEDICATIONS ORDERED IN ED: Medications - No data to display  External physician / consultants:  I spoke with radiologist regarding care plan for this patient.   IMPRESSION / MDM / ASSESSMENT AND PLAN / ED COURSE  I reviewed the triage vital signs and the nursing notes.  Patient's presentation is most consistent with acute presentation with potential threat to life or bodily function.  Differential diagnosis includes, but is not limited to, urinary tract infection, STI, TOA, torsion, intra-abdominal infection like appendicitis    MDM: Broad differential diagnosis as above plan for CT of the abdomen pelvis to rule intra-abdominal infection like appendicitis, STI swabs, urinalysis, transvaginal ultrasound for ovarian pathology or torsion.  Patient comfortable during the workup but needs to leave to pick up her son at school.  I reviewed transvaginal ultrasound with radiologist in [place] sees no acute abnormalities.  Her workup was significant for trichomoniasis.  She does not for her CT scan and understands the risks including undiagnosed conditions like appendicitis that may worsen to cause severe illness or even death.  She knows to return to the emergency department if any worsening.  She got a prescription for Flagyl for her trichomoniasis.  Defer empiric treatment for other STI like  gonorrhea chlamydia she will follow-up for the results of these test and discuss with her primary doctor.  I considered hospitalization for admission or observation for serial abdominal exams and further evaluation with CT scan of the abdomen pelvis but the patient opted for discharge at this time and will return if any worsening and she will follow-up with PMD.        FINAL CLINICAL IMPRESSION(S) / ED DIAGNOSES   Final diagnoses:  Pelvic pain in female  Trichomoniasis     Rx / DC Orders   ED Discharge Orders          Ordered    metroNIDAZOLE (FLAGYL) 500 MG tablet  2 times daily        08/15/22 1334             Note:  This document was prepared using Dragon voice recognition software and may include unintentional dictation errors.    Lucillie Garfinkel, MD 08/15/22 1344

## 2022-08-15 NOTE — Discharge Instructions (Signed)
Take antibiotics for the full course as prescribed.  Thank you for choosing us for your health care today!  Please see your primary doctor this week for a follow up appointment.   Sometimes, in the early stages of certain disease courses it is difficult to detect in the emergency department evaluation -- so, it is important that you continue to monitor your symptoms and call your doctor right away or return to the emergency department if you develop any new or worsening symptoms.  Please go to the following website to schedule new (and existing) patient appointments:   [email or web address]  If you do not have a primary doctor try calling the following clinics to establish care:  If you have insurance:  [facility] [phone] [address] [place] [address]   [facility]  [phone] [address] [place] [address] [place] [address]   If you do not have insurance:  [facility]  [phone] [address] [place] [address]   The following is another list of primary care offices in the area who are accepting new patients at this time.  Please reach out to one of them directly and let them know you would like to schedule an appointment to follow up on an Emergency Department visit, and/or to establish a new primary care provider (PCP).  There are likely other primary care clinics in the are who are accepting new patients, but this is an excellent place to start:  [facility] Lead physician: Dr Angela Bacigalupo [address] [place], [place] [address] [phone]  [facility] Lead Physician: Dr Krichna Sowles [address], [place], [place] [address] [phone]  [facility]  Lead Physician: Dr Megan Johnson [address], [place], [place] [address] [phone]  [facility] [place] [facility] Lead Physician: Dr Alex Karamalegos [address], [place],  [place] [address] [phone]  [facility] [place] Lead Physician: Dr Laura Berglund [address], [place], [place] [address] [phone]   It was my pleasure to care for you today.   Reeda Soohoo S. Bernise Sylvain, MD  

## 2022-08-15 NOTE — ED Triage Notes (Addendum)
Pt comes with c/o abdominal pain. Pt states some cramping, pressure and spotting. Pt states she is pregnant but doesn't know how far along.

## 2022-08-15 NOTE — ED Notes (Signed)
Pt transported to CT/US. 

## 2022-11-28 ENCOUNTER — Ambulatory Visit (LOCAL_COMMUNITY_HEALTH_CENTER): Payer: Commercial Managed Care - PPO

## 2022-11-28 VITALS — BP 113/72 | Ht 64.0 in | Wt 164.0 lb

## 2022-11-28 DIAGNOSIS — Z3202 Encounter for pregnancy test, result negative: Secondary | ICD-10-CM

## 2022-11-28 DIAGNOSIS — Z3009 Encounter for other general counseling and advice on contraception: Secondary | ICD-10-CM

## 2022-11-28 LAB — PREGNANCY, URINE: Preg Test, Ur: NEGATIVE

## 2022-11-28 NOTE — Progress Notes (Signed)
UPT negative today. Patient states frustration as home UPT 2 days ago was positive. Also she is experiencing nausea and breast tenderness.   On no birth control method. LMP 12/27/22. States unprotected sex dates: 4/29/4/26, 4/20, 4/19, 4/5  RN discussed negative test results today. Discussed that preg test captures if became preg from sex 2 weeks ago and before. Advised patient may return for another UPT in 1 to 2 weeks if no period. Advised to abstain from sex until then in order to get accurate result. Advised to schedule early morning appt in order to use first urine of the day.   After this was explained to patient, she declines to stay for remainder of nurse appt including update of medical hx, flow sheet, etc. Declines negative preg packet. States she will return for another UPT. Jerel Shepherd, RN

## 2022-12-06 ENCOUNTER — Ambulatory Visit
Admission: EM | Admit: 2022-12-06 | Discharge: 2022-12-06 | Disposition: A | Discharge status: Home / Self Care | Payer: Commercial Managed Care - PPO | Attending: Emergency Medicine | Admitting: Emergency Medicine

## 2022-12-06 DIAGNOSIS — R1030 Lower abdominal pain, unspecified: Secondary | ICD-10-CM

## 2022-12-06 DIAGNOSIS — R110 Nausea: Secondary | ICD-10-CM

## 2022-12-06 DIAGNOSIS — Z3202 Encounter for pregnancy test, result negative: Secondary | ICD-10-CM | POA: Diagnosis not present

## 2022-12-06 LAB — POCT URINALYSIS DIP (MANUAL ENTRY)
Bilirubin, UA: NEGATIVE
Glucose, UA: NEGATIVE mg/dL
Ketones, POC UA: NEGATIVE mg/dL
Leukocytes, UA: NEGATIVE
Nitrite, UA: NEGATIVE
Protein Ur, POC: NEGATIVE mg/dL
Spec Grav, UA: 1.01 (ref 1.010–1.025)
Urobilinogen, UA: 0.2 E.U./dL
pH, UA: 6.5 (ref 5.0–8.0)

## 2022-12-06 LAB — POCT URINE PREGNANCY: Preg Test, Ur: NEGATIVE

## 2022-12-06 MED ORDER — ONDANSETRON 4 MG PO TBDP: 4.0000 mg | ORAL_TABLET | Freq: Three times a day (TID) | ORAL | 0 refills | Status: DC | PRN

## 2022-12-06 NOTE — ED Provider Notes (Signed)
Renaldo Fiddler    CSN: [id number] Arrival date & time: 12/06/22  4098      History   Chief Complaint Chief Complaint  Patient presents with   Abdominal Pain    Discomfort feeling - Entered by patient    HPI Brandy Luna is a 37 y.o. female.  Patient presents with lower abdominal pain x 3 days.  She is currently on her menstrual cycle, which she reports is intermittently heavier than usual with clots.  She has mild nausea.  She reports a positive pregnancy test at home 2 weeks ago and then a [facility] pregnancy [facility] on 11/28/2022.  She denies fever, rash, pelvic pain, vomiting, diarrhea, constipation, dysuria, or other symptoms.    The history is provided by the patient and medical records.    Past Medical History:  Diagnosis Date   Anemia    Anxiety    Depression    Pneumonia     Patient Active Problem List   Diagnosis Date Noted   Trichomonas vaginalis (TV) infection 11/27/2016   Right lower quadrant pain 10/11/2016   Preterm premature rupture of membranes (PPROM) with onset of labor after 24 hours of rupture in first trimester, antepartum 10/10/2016   [redacted] weeks gestation of pregnancy 10/10/2016   Labor and delivery indication for care or intervention 10/09/2016   High risk pregnancy due to assisted reproductive technology, antepartum 10/02/2016   History of cesarean delivery 10/02/2016   History of substance abuse (HCC) 10/02/2016   Vaginal discharge 10/02/2016   Yeast vaginitis 10/02/2016   Indication for care in labor or delivery 08/07/2016   Abnormal cervical cytology 05/04/2016   Encounter for antenatal screening 05/03/2016   Adjustment disorder with mixed anxiety and depressed mood 04/24/2016    Past Surgical History:  Procedure Laterality Date   CESAREAN SECTION     CESAREAN SECTION N/A 10/10/2016   Procedure: CESAREAN SECTION;  Surgeon: Vena Austria, MD;  Location: [facility] ORS;  Service: Obstetrics;  Laterality: N/A;    CESAREAN SECTION     TONSILLECTOMY      OB History     Gravida  4   Para  1   Term      Preterm  1   AB      Living  3      SAB      IAB      Ectopic      Multiple  0   Live Births  1            Home Medications    Prior to Admission medications   Medication Sig Start Date End Date Taking? Authorizing Provider  ondansetron (ZOFRAN-ODT) 4 MG disintegrating tablet Take 1 tablet (4 mg total) by mouth every 8 (eight) hours as needed for nausea or vomiting. 12/06/22  Yes Mickie Bail, NP  brompheniramine-pseudoephedrine-DM 30-2-10 MG/5ML syrup Take 5 mLs by mouth 4 (four) times daily as needed. Patient not taking: Reported on 12/06/2022 04/07/18   Joni Reining, PA-C  chlorpheniramine-HYDROcodone [facility]) 10-8 MG/5ML SUER Take 5 mLs by mouth 2 (two) times daily. Patient not taking: Reported on 01/22/2018 10/09/17   Irean Hong, MD  cyclobenzaprine (FLEXERIL) 10 MG tablet Take 1/2 to one tablet q8 hours prn Patient not taking: Reported on 01/22/2018 08/07/16   Farrel Conners, CNM  ibuprofen (ADVIL,MOTRIN) 600 MG tablet Take 1 tablet (600 mg total) by mouth every 6 (six) hours. Patient not taking: Reported on 01/22/2018 10/14/16  Conard Novak, MD  ibuprofen (ADVIL,MOTRIN) 600 MG tablet Take 1 tablet (600 mg total) by mouth every 8 (eight) hours as needed. 04/07/18   Joni Reining, PA-C  meloxicam (MOBIC) 15 MG tablet Take 1 tablet (15 mg total) by mouth daily. 01/17/18   Cuthriell, Delorise Royals, PA-C  oxyCODONE-acetaminophen (PERCOCET/ROXICET) 5-325 MG tablet Take 2 tablets by mouth every 4 (four) hours as needed (breakthrough pain). Patient not taking: Reported on 01/22/2018 10/14/16   Conard Novak, MD    Family History Family History  Problem Relation Age of Onset   Diabetes Mother    Hypertension Mother    Diabetes Maternal Grandmother    Stroke Maternal Grandmother    Hypertension Maternal Grandmother    Heart attack Maternal  Grandmother    Diabetes Maternal Grandfather    Stroke Maternal Grandfather    Hypertension Maternal Grandfather    Heart attack Maternal Grandfather    Asthma Sister    Osteoporosis Paternal Grandmother     Social History Social History   Tobacco Use   Smoking status: Every Day    Packs/day: .5    Types: Cigarettes   Smokeless tobacco: Never  Substance Use Topics   Alcohol use: No   Drug use: Yes    Types: Marijuana     Allergies   Latex and Zithromax [azithromycin]   Review of Systems Review of Systems  Constitutional:  Negative for chills and fever.  Gastrointestinal:  Positive for abdominal pain and nausea. Negative for constipation, diarrhea and vomiting.  Genitourinary:  Positive for vaginal bleeding. Negative for dysuria, flank pain, frequency, hematuria, pelvic pain and vaginal discharge.     Physical Exam Triage Vital Signs ED Triage Vitals [12/06/22 0937]  Enc Vitals Group     BP 115/80     Pulse Rate 75     Resp 18     Temp 98.2 F (36.8 C)     Temp src      SpO2 99 %     Weight      Height      Head Circumference      Peak Flow      Pain Score      Pain Loc      Pain Edu?      Excl. in GC?    No data found.  Updated Vital Signs BP 115/80   Pulse 75   Temp 98.2 F (36.8 C)   Resp 18   LMP 12/03/2022   SpO2 99%   Visual Acuity Right Eye Distance:   Left Eye Distance:   Bilateral Distance:    Right Eye Near:   Left Eye Near:    Bilateral Near:     Physical Exam Vitals and nursing note reviewed.  Constitutional:      General: She is not in acute distress.    Appearance: Normal appearance. She is well-developed. She is not ill-appearing.  HENT:     Mouth/Throat:     Mouth: Mucous membranes are moist.  Cardiovascular:     Rate and Rhythm: Normal rate and regular rhythm.     Heart sounds: Normal heart sounds.  Pulmonary:     Effort: Pulmonary effort is normal. No respiratory distress.     Breath sounds: Normal breath  sounds.  Abdominal:     General: Bowel sounds are normal.     Palpations: Abdomen is soft.     Tenderness: There is no abdominal tenderness. There is no right CVA tenderness, left  CVA tenderness, guarding or rebound.  Musculoskeletal:     Cervical back: Neck supple.  Skin:    General: Skin is warm and dry.  Neurological:     Mental Status: She is alert.  Psychiatric:        Mood and Affect: Mood normal.        Behavior: Behavior normal.      UC Treatments / Results  Labs (all labs ordered are listed, but only abnormal results are displayed) Labs Reviewed  POCT URINALYSIS DIP (MANUAL ENTRY) - Abnormal; Notable for the following components:      Result Value   Clarity, UA cloudy (*)    Blood, UA large (*)    All other components within normal limits  POCT URINE PREGNANCY    EKG   Radiology No results found.  Procedures Procedures (including critical care time)  Medications Ordered in UC Medications - No data to display  Initial Impression / Assessment and Plan / UC Course  I have reviewed the triage vital signs and the nursing notes.  Pertinent labs & imaging results that were available during my care of the patient were reviewed by me and considered in my medical decision making (see chart for details).   Lower abdominal pain, Negative pregnancy test, Nausea without vomiting.   VSS.  Urine negative for infection.  Urine pregnancy negative.  Abdomen is soft and nontender with good bowel sounds.  Treating nausea with Zofran.  Discussed limitations of evaluation of her symptoms in an urgent care setting.  Instructed patient to go to the ED if she has persistent or worsening abdominal pain or other concerning symptoms.  Instructed her to follow-up with her PCP or gynecologist.  She agrees to plan of care.    Final Clinical Impressions(s) / UC Diagnoses   Final diagnoses:  Lower abdominal pain  Negative pregnancy test  Nausea without vomiting     Discharge  Instructions      Urine pregnancy test is negative.  Take the Zofran as directed for nausea.    Go to the emergency department if you have persistent or worsening abdominal pain.    Follow-up with your primary care provider or gynecologist.     ED Prescriptions     Medication Sig Dispense Auth. Provider   ondansetron (ZOFRAN-ODT) 4 MG disintegrating tablet Take 1 tablet (4 mg total) by mouth every 8 (eight) hours as needed for nausea or vomiting. 20 tablet Mickie Bail, NP      PDMP not reviewed this encounter.   Mickie Bail, NP 12/06/22 1010

## 2022-12-06 NOTE — Discharge Instructions (Addendum)
Urine pregnancy test is negative.  Take the Zofran as directed for nausea.    Go to the emergency department if you have persistent or worsening abdominal pain.    Follow-up with your primary care provider or gynecologist.

## 2022-12-06 NOTE — ED Triage Notes (Signed)
Patient to Urgent Care with complaints of lower abdominal pain- describes pain similar to menstrual cramping. Patient reports she started her period and it was heavier than usual with golf ball sized clots. Patient reports symptoms started Monday.  Also reports that her nipples have been sensitive this morning.   States that she had a positive home pregnancy test two weeks ago, then a negative pregnancy test at the health department last Wednesday.

## 2023-04-16 ENCOUNTER — Other Ambulatory Visit: Payer: Self-pay

## 2023-04-16 ENCOUNTER — Emergency Department: Payer: MEDICAID

## 2023-04-16 ENCOUNTER — Emergency Department
Admission: EM | Admit: 2023-04-16 | Discharge: 2023-04-16 | Disposition: A | Discharge status: Home / Self Care | Payer: MEDICAID | Attending: Emergency Medicine | Admitting: Emergency Medicine

## 2023-04-16 DIAGNOSIS — R112 Nausea with vomiting, unspecified: Secondary | ICD-10-CM | POA: Diagnosis present

## 2023-04-16 DIAGNOSIS — B349 Viral infection, unspecified: Secondary | ICD-10-CM | POA: Insufficient documentation

## 2023-04-16 DIAGNOSIS — U071 COVID-19: Secondary | ICD-10-CM | POA: Diagnosis not present

## 2023-04-16 LAB — PREGNANCY, URINE: Preg Test, Ur: NEGATIVE

## 2023-04-16 LAB — URINALYSIS, ROUTINE W REFLEX MICROSCOPIC
Bilirubin Urine: NEGATIVE
Glucose, UA: NEGATIVE mg/dL
Ketones, ur: 5 mg/dL — AB
Nitrite: NEGATIVE
Protein, ur: 100 mg/dL — AB
RBC / HPF: >50 RBC/hpf (ref 0–5)
Specific Gravity, Urine: 1.033 — ABNORMAL HIGH (ref 1.005–1.030)
pH: 5 (ref 5.0–8.0)

## 2023-04-16 LAB — CBC
HCT: 35.3 % — ABNORMAL LOW (ref 36.0–46.0)
Hemoglobin: 11.6 g/dL — ABNORMAL LOW (ref 12.0–15.0)
MCH: 28.2 pg (ref 26.0–34.0)
MCHC: 32.9 g/dL (ref 30.0–36.0)
MCV: 85.9 fL (ref 80.0–100.0)
Platelets: 220 10*3/uL (ref 150–400)
RBC: 4.11 MIL/uL (ref 3.87–5.11)
RDW: 13.4 % (ref 11.5–15.5)
WBC: 3.2 10*3/uL — ABNORMAL LOW (ref 4.0–10.5)
nRBC: 0 % (ref 0.0–0.2)

## 2023-04-16 LAB — COMPREHENSIVE METABOLIC PANEL
ALT: 12 U/L (ref 0–44)
AST: 19 U/L (ref 15–41)
Albumin: 3.8 g/dL (ref 3.5–5.0)
Alkaline Phosphatase: 51 U/L (ref 38–126)
Anion gap: 10 (ref 5–15)
BUN: 8 mg/dL (ref 6–20)
CO2: 23 mmol/L (ref 22–32)
Calcium: 8.7 mg/dL — ABNORMAL LOW (ref 8.9–10.3)
Chloride: 103 mmol/L (ref 98–111)
Creatinine, Ser: 0.64 mg/dL (ref 0.44–1.00)
GFR, Estimated: >60 mL/min (ref >60)
Glucose, Bld: 83 mg/dL (ref 70–99)
Potassium: 3.2 mmol/L — ABNORMAL LOW (ref 3.5–5.1)
Sodium: 136 mmol/L (ref 135–145)
Total Bilirubin: 0.6 mg/dL (ref 0.3–1.2)
Total Protein: 7.1 g/dL (ref 6.5–8.1)

## 2023-04-16 LAB — SARS CORONAVIRUS 2 BY RT PCR: SARS Coronavirus 2 by RT PCR: POSITIVE — AB

## 2023-04-16 LAB — LIPASE, BLOOD: Lipase: 21 U/L (ref 11–51)

## 2023-04-16 MED ORDER — ONDANSETRON 4 MG PO TBDP: 4.0000 mg | ORAL_TABLET | Freq: Three times a day (TID) | ORAL | 0 refills | Status: DC | PRN

## 2023-04-16 MED ORDER — ONDANSETRON HCL 4 MG/2ML IJ SOLN
4.0000 mg | Freq: Once | INTRAMUSCULAR | Status: AC
  Administered 2023-04-16: 4 mg via INTRAVENOUS
  Filled 2023-04-16: qty 2

## 2023-04-16 MED ORDER — NAPROXEN 500 MG PO TABS: 500.0000 mg | ORAL_TABLET | Freq: Two times a day (BID) | ORAL | 0 refills | Status: AC

## 2023-04-16 MED ORDER — SODIUM CHLORIDE 0.9 % IV BOLUS
1000.0000 mL | Freq: Once | INTRAVENOUS | Status: AC
  Administered 2023-04-16: 1000 mL via INTRAVENOUS

## 2023-04-16 MED ORDER — ONDANSETRON 4 MG PO TBDP: 4.0000 mg | ORAL_TABLET | Freq: Three times a day (TID) | ORAL | 0 refills | Status: AC | PRN

## 2023-04-16 MED ORDER — KETOROLAC TROMETHAMINE 15 MG/ML IJ SOLN
15.0000 mg | Freq: Once | INTRAMUSCULAR | Status: AC
  Administered 2023-04-16: 15 mg via INTRAVENOUS
  Filled 2023-04-16: qty 1

## 2023-04-16 MED ORDER — NAPROXEN 500 MG PO TABS: 500.0000 mg | ORAL_TABLET | Freq: Two times a day (BID) | ORAL | 0 refills | Status: DC

## 2023-04-16 NOTE — ED Triage Notes (Signed)
Pt comes with c/o N/V and belly pain. Pt states this started on Monday. Pt states hurting all over and not feeling good.

## 2023-04-16 NOTE — ED Provider Notes (Signed)
[facility] Provider Note    Event Date/Time   First MD Initiated Contact with Patient 04/16/23 1030     (approximate)   History   Chief Complaint: Abdominal Pain   HPI  Jariah Fredrick is a 37 y.o. female with a history of anxiety, depression who comes ED complaining of nausea vomiting and generalized abdominal pain.  Started yesterday, gradual onset, also accompanied with malaise, body aches, fatigue.  No chest pain or shortness of breath.  No diarrhea, has been constipated for 1 week     Physical Exam   Triage Vital Signs: ED Triage Vitals  Encounter Vitals Group     BP 04/16/23 0914 125/87     Systolic BP Percentile --      Diastolic BP Percentile --      Pulse Rate 04/16/23 0914 94     Resp 04/16/23 0914 18     Temp 04/16/23 0914 98.2 F (36.8 C)     Temp src --      SpO2 04/16/23 0914 100 %     Weight 04/16/23 1032 164 lb 0.4 oz (74.4 kg)     Height 04/16/23 1032 5\' 4"  (1.626 m)     Head Circumference --      Peak Flow --      Pain Score 04/16/23 0913 8     Pain Loc --      Pain Education --      Exclude from Growth Chart --     Most recent vital signs: Vitals:   04/16/23 0914  BP: 125/87  Pulse: 94  Resp: 18  Temp: 98.2 F (36.8 C)  SpO2: 100%    General: Awake, no distress.  CV:  Good peripheral perfusion.  Regular rate rhythm Resp:  Normal effort.  Abd:  No distention.  Soft with mild epigastric tenderness Other:  Somewhat dry oral mucosa   ED Results / Procedures / Treatments   Labs (all labs ordered are listed, but only abnormal results are displayed) Labs Reviewed  SARS CORONAVIRUS 2 BY RT PCR - Abnormal; Notable for the following components:      Result Value   SARS Coronavirus 2 by RT PCR POSITIVE (*)    All other components within normal limits  COMPREHENSIVE METABOLIC PANEL - Abnormal; Notable for the following components:   Potassium 3.2 (*)    Calcium 8.7 (*)    All other components within normal limits   CBC - Abnormal; Notable for the following components:   WBC 3.2 (*)    Hemoglobin 11.6 (*)    HCT 35.3 (*)    All other components within normal limits  URINALYSIS, ROUTINE W REFLEX MICROSCOPIC - Abnormal; Notable for the following components:   Color, Urine AMBER (*)    APPearance CLOUDY (*)    Specific Gravity, Urine 1.033 (*)    Hgb urine dipstick LARGE (*)    Ketones, ur 5 (*)    Protein, ur 100 (*)    Leukocytes,Ua TRACE (*)    Bacteria, UA MANY (*)    All other components within normal limits  LIPASE, BLOOD  PREGNANCY, URINE  POC URINE PREG, ED     EKG    RADIOLOGY CT abdomen pelvis interpreted by me, negative for obstructing ureterolithiasis or appendicitis.  Radiology report reviewed   PROCEDURES:  Procedures   MEDICATIONS ORDERED IN ED: Medications  sodium chloride 0.9 % bolus 1,000 mL (1,000 mLs Intravenous New Bag/Given 04/16/23 1127)  ketorolac (TORADOL) 15  MG/ML injection 15 mg (15 mg Intravenous Given 04/16/23 1126)  ondansetron (ZOFRAN) injection 4 mg (4 mg Intravenous Given 04/16/23 1126)     IMPRESSION / MDM / ASSESSMENT AND PLAN / ED COURSE  I reviewed the triage vital signs and the nursing notes.  DDx: Ureterolithiasis, cystitis, appendicitis, viral illness, COVID, electrolyte abnormality, AKI, pregnancy  Patient's presentation is most consistent with acute presentation with potential threat to life or bodily function.  Patient presents with generalized abdominal pain nausea vomiting.  Pregnancy test is negative.  Serum labs unremarkable.  Urinalysis with red cells but not consistent with UTI.  Contaminated, not a clean specimen.  COVID test is positive explaining her symptoms.  CT of the abdomen does not show any other concerning findings.  Will continue supportive care at home, stable for discharge.   Clinical Course as of 04/16/23 1204  Tue Apr 16, 2023  1057 Pregnancy, urine [MM]  1057 Urinalysis, Routine w reflex microscopic -Urine,  Random(!) [MM]    Clinical Course User Index [MM] Minor, Mason, Student-PA     FINAL CLINICAL IMPRESSION(S) / ED DIAGNOSES   Final diagnoses:  COVID-19 virus infection  Acute viral syndrome     Rx / DC Orders   ED Discharge Orders          Ordered    ondansetron (ZOFRAN-ODT) 4 MG disintegrating tablet  Every 8 hours PRN        04/16/23 1153    naproxen (NAPROSYN) 500 MG tablet  2 times daily with meals        04/16/23 1153             Note:  This document was prepared using Dragon voice recognition software and may include unintentional dictation errors.   Sharman Cheek, MD 04/16/23 1204

## 2023-04-16 NOTE — ED Notes (Signed)
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  EDP Stafford informed.
